# Patient Record
Sex: Female | Born: 1960 | Race: Black or African American | Hispanic: No | Smoking: Never smoker
Health system: Southern US, Community
[De-identification: ages and names within clinical notes are randomized; demographics above are authoritative.]

## PROBLEM LIST (undated history)

## (undated) DIAGNOSIS — T4145XA Adverse effect of unspecified anesthetic, initial encounter: Secondary | ICD-10-CM

## (undated) DIAGNOSIS — I10 Essential (primary) hypertension: Secondary | ICD-10-CM

## (undated) DIAGNOSIS — Z9289 Personal history of other medical treatment: Secondary | ICD-10-CM

## (undated) DIAGNOSIS — D649 Anemia, unspecified: Secondary | ICD-10-CM

## (undated) DIAGNOSIS — K922 Gastrointestinal hemorrhage, unspecified: Secondary | ICD-10-CM

## (undated) HISTORY — PX: BREAST CYST ASPIRATION: SHX578

## (undated) HISTORY — DX: Gastrointestinal hemorrhage, unspecified: K92.2

## (undated) HISTORY — PX: BREAST BIOPSY: SHX20

---

## 1995-06-17 DIAGNOSIS — T8859XA Other complications of anesthesia, initial encounter: Secondary | ICD-10-CM

## 1995-06-17 HISTORY — DX: Other complications of anesthesia, initial encounter: T88.59XA

## 2004-05-14 ENCOUNTER — Ambulatory Visit: Payer: Self-pay

## 2005-05-20 ENCOUNTER — Ambulatory Visit: Payer: Self-pay | Admitting: Obstetrics and Gynecology

## 2005-06-03 ENCOUNTER — Ambulatory Visit: Payer: Self-pay | Admitting: Obstetrics and Gynecology

## 2006-05-26 ENCOUNTER — Ambulatory Visit: Payer: Self-pay | Admitting: Obstetrics and Gynecology

## 2006-06-12 ENCOUNTER — Ambulatory Visit: Payer: Self-pay | Admitting: Obstetrics and Gynecology

## 2007-06-29 ENCOUNTER — Ambulatory Visit: Payer: Self-pay | Admitting: Obstetrics and Gynecology

## 2008-08-08 ENCOUNTER — Ambulatory Visit: Payer: Self-pay | Admitting: Obstetrics and Gynecology

## 2009-08-09 ENCOUNTER — Ambulatory Visit: Payer: Self-pay | Admitting: Obstetrics and Gynecology

## 2010-08-28 ENCOUNTER — Ambulatory Visit: Payer: Self-pay | Admitting: Obstetrics and Gynecology

## 2011-09-04 ENCOUNTER — Ambulatory Visit: Payer: Self-pay | Admitting: Obstetrics and Gynecology

## 2012-10-11 ENCOUNTER — Ambulatory Visit: Payer: Self-pay | Admitting: Obstetrics and Gynecology

## 2012-11-02 ENCOUNTER — Ambulatory Visit: Payer: Self-pay | Admitting: Family Medicine

## 2012-12-14 ENCOUNTER — Ambulatory Visit: Payer: Self-pay | Admitting: Emergency Medicine

## 2014-01-17 ENCOUNTER — Ambulatory Visit: Payer: Self-pay | Admitting: Obstetrics and Gynecology

## 2015-01-11 ENCOUNTER — Emergency Department
Admission: EM | Admit: 2015-01-11 | Discharge: 2015-01-12 | Disposition: A | Discharge status: Home / Self Care | Payer: BLUE CROSS/BLUE SHIELD | Attending: Student | Admitting: Student

## 2015-01-11 ENCOUNTER — Encounter: Payer: Self-pay | Admitting: Emergency Medicine

## 2015-01-11 DIAGNOSIS — X58XXXA Exposure to other specified factors, initial encounter: Secondary | ICD-10-CM | POA: Insufficient documentation

## 2015-01-11 DIAGNOSIS — L500 Allergic urticaria: Secondary | ICD-10-CM | POA: Diagnosis not present

## 2015-01-11 DIAGNOSIS — Y9289 Other specified places as the place of occurrence of the external cause: Secondary | ICD-10-CM | POA: Insufficient documentation

## 2015-01-11 DIAGNOSIS — Y998 Other external cause status: Secondary | ICD-10-CM | POA: Diagnosis not present

## 2015-01-11 DIAGNOSIS — Y9389 Activity, other specified: Secondary | ICD-10-CM | POA: Insufficient documentation

## 2015-01-11 DIAGNOSIS — T7840XA Allergy, unspecified, initial encounter: Secondary | ICD-10-CM | POA: Insufficient documentation

## 2015-01-11 DIAGNOSIS — I10 Essential (primary) hypertension: Secondary | ICD-10-CM | POA: Diagnosis not present

## 2015-01-11 HISTORY — DX: Essential (primary) hypertension: I10

## 2015-01-11 MED ORDER — EPINEPHRINE 0.3 MG/0.3ML IJ SOAJ: 0.3000 mg | Freq: Once | INTRAMUSCULAR | Status: DC

## 2015-01-11 MED ORDER — FAMOTIDINE IN NACL 20-0.9 MG/50ML-% IV SOLN
20.0000 mg | Freq: Once | INTRAVENOUS | Status: AC
  Administered 2015-01-11: 20 mg via INTRAVENOUS
  Filled 2015-01-11: qty 50

## 2015-01-11 MED ORDER — SODIUM CHLORIDE 0.9 % IV BOLUS (SEPSIS)
1000.0000 mL | Freq: Once | INTRAVENOUS | Status: AC
  Administered 2015-01-11: 1000 mL via INTRAVENOUS

## 2015-01-11 MED ORDER — DIPHENHYDRAMINE HCL 50 MG/ML IJ SOLN
INTRAMUSCULAR | Status: AC
  Administered 2015-01-11: 25 mg via INTRAVENOUS
  Filled 2015-01-11: qty 1

## 2015-01-11 MED ORDER — METHYLPREDNISOLONE SODIUM SUCC 125 MG IJ SOLR
125.0000 mg | Freq: Once | INTRAMUSCULAR | Status: AC
  Administered 2015-01-11: 125 mg via INTRAVENOUS

## 2015-01-11 MED ORDER — DIPHENHYDRAMINE HCL 50 MG/ML IJ SOLN
25.0000 mg | Freq: Once | INTRAMUSCULAR | Status: AC
  Administered 2015-01-11: 25 mg via INTRAVENOUS

## 2015-01-11 MED ORDER — PREDNISONE 20 MG PO TABS: 60.0000 mg | ORAL_TABLET | Freq: Every day | ORAL | Status: DC

## 2015-01-11 MED ORDER — EPINEPHRINE HCL 1 MG/ML IJ SOLN
INTRAMUSCULAR | Status: AC
  Administered 2015-01-11: 0.3 mg
  Filled 2015-01-11: qty 1

## 2015-01-11 MED ORDER — METHYLPREDNISOLONE SODIUM SUCC 125 MG IJ SOLR
INTRAMUSCULAR | Status: AC
  Administered 2015-01-11: 125 mg via INTRAVENOUS
  Filled 2015-01-11: qty 2

## 2015-01-11 MED ORDER — EPINEPHRINE HCL 0.1 MG/ML IJ SOSY: 0.3000 mg | PREFILLED_SYRINGE | Freq: Once | INTRAMUSCULAR | Status: AC

## 2015-01-11 NOTE — ED Provider Notes (Signed)
[facility] Emergency Department Provider Note  ____________________________________________  Time seen: Approximately 10:47 PM  I have reviewed the triage vital signs and the nursing notes.   HISTORY  Chief Complaint Allergic Reaction    HPI Cathy Clark is a 54 y.o. female with history of hypertension on metoprolol and Norvasc presents for evaluation of allergic reaction which began suddenly just prior to arrival. Patient reports that this evening she developed rash as well as severe swelling of her upper lip. No trouble breathing, no cough, no vomiting. She has known allergy to shrimp however has not come in contact with any shrimp/shellfish. She has taken aspirin and BC powders but does not take ACE inhibitor or arb. Current severity of symptoms is moderate to severe. No modifying factors. Prior to today she had been in her usual state of health.   Past Medical History  Diagnosis Date  . Hypertension     There are no active problems to display for this patient.   History reviewed. No pertinent past surgical history.  No current outpatient prescriptions on file.  Allergies Shrimp  No family history on file.  Social History History  Substance Use Topics  . Smoking status: Never Smoker   . Smokeless tobacco: Not on file  . Alcohol Use: No    Review of Systems Constitutional: No fever/chills Eyes: No visual changes. ENT: No sore throat. Cardiovascular: Denies chest pain. Respiratory: Denies shortness of breath. Gastrointestinal: No abdominal pain.  No nausea, no vomiting.  No diarrhea.  No constipation. Genitourinary: Negative for dysuria. Musculoskeletal: Negative for back pain. Skin: Negative for rash. Neurological: Negative for headaches, focal weakness or numbness.  10-point ROS otherwise negative.  ____________________________________________   PHYSICAL EXAM:  VITAL SIGNS: ED Triage Vitals  Enc Vitals Group     BP  01/11/15 2244 138/97 mmHg     Pulse Rate 01/11/15 2244 96     Resp 01/11/15 2244 20     Temp 01/11/15 2244 100.2 F (37.9 C)     Temp Source 01/11/15 2244 Oral     SpO2 01/11/15 2244 100 %     Weight 01/11/15 2244 142 lb (64.411 kg)     Height 01/11/15 2244 5\' 6"  (1.676 m)     Head Cir --      Peak Flow --      Pain Score --      Pain Loc --      Pain Edu? --      Excl. in GC? --     Constitutional: Alert and oriented. Nontoxic appearing, in no acute distress. Eyes: Conjunctivae are normal. PERRL. EOMI. Head: Atraumatic. Nose: No congestion/rhinnorhea. Mouth/Throat: Mucous membranes are moist.  Oropharynx non-erythematous. Clear oropharynx. Severe edema of the upper lip. Neck: No stridor.   Cardiovascular: Normal rate, regular rhythm. Grossly normal heart sounds.  Good peripheral circulation. Respiratory: Normal respiratory effort.  No retractions. Lungs CTAB. Gastrointestinal: Soft and nontender. No distention. No abdominal bruits. No CVA tenderness. Genitourinary: deferred Musculoskeletal: No lower extremity tenderness nor edema.  No joint effusions. Neurologic:  Normal speech and language. No gross focal neurologic deficits are appreciated. No gait instability. Skin:  Diffuse raised wheals consistent with urticaria Psychiatric: Mood and affect are normal. Speech and behavior are normal.  ____________________________________________   LABS (all labs ordered are listed, but only abnormal results are displayed)  Labs Reviewed - No data to display ____________________________________________  EKG  none ____________________________________________  RADIOLOGY  none ____________________________________________   PROCEDURES  Procedure(s)  performed: None  Critical Care performed: None ____________________________________________   INITIAL IMPRESSION / ASSESSMENT AND PLAN / ED COURSE  Pertinent labs & imaging results that were available during my care of the  patient were reviewed by me and considered in my medical decision making (see chart for details).  Cathy Clark is a 54 y.o. female with history of hypertension on metoprolol and Norvasc presents for evaluation of severe allergic reaction (cause unknown) which began suddenly just prior to arrival. On arrival to the emergency department, she is in no acute distress but she does have severe swelling isolated to the upper lip. No airway involvement or concern for impending airway collapse. She also has diffuse urticaria. We'll treat with epinephrine, fluids, Solu-Medrol, Pepcid, Benadryl with frequent reassessments. If her symptoms improve after sufficient observation, anticipate discharge home. Care transferred to Dr. Fanny Bien at 11 PM. ____________________________________________   FINAL CLINICAL IMPRESSION(S) / ED DIAGNOSES  Final diagnoses:  Allergic reaction, initial encounter      Gayla Doss, MD 01/11/15 [phone]

## 2015-01-11 NOTE — ED Notes (Addendum)
Patient present to ED with c/o allergic reaction to possibly blood pressure medicine, patient takes Metoprolol and Norvasc. Welts noted to bilateral arms and swelling noted to upper lips and right eye. Patient speaking in complete sentences, no increased work in respiration noted. Patient reports symptoms began within 1 hour to 1.5 hour PTA. MD at bedside.

## 2015-01-12 NOTE — ED Provider Notes (Signed)
-----------------------------------------   2:57 AM on 01/12/2015 -----------------------------------------  Reevaluated the patient. She is here now stable and she states that her hives are gone and her swelling remains in her lip. Patient does have angioedema of the upper lip without evidence of airway compromise. Oropharynx is clear. Because of the ongoing angioedema I'll continue to observe her and I did discuss and reviewed her medication list and I'm concerned Norvasc could be an etiology. I have instructed her to stop Norvasc, and she is agreeable. We'll continue to observe her in the ER for additional 3 hours, should her swelling improved we would discharge her. I did discuss good return precautions and treatment recommendations and she is very reasonable and accommodating. No distress.  ----------------------------------------- 4:56 AM on 01/12/2015 -----------------------------------------  Patient's lip edema has improved significantly. No oral edema. No hives, itching rate clear lungs and stable vital signs. She is fully awake and alert. We'll discharge her to home with prescription for an epinephrine pen for which I have instructed her on use and to call 911 right away should she need to use it for another severe reaction. She will stop Norvasc and follow-up with her doctor.  Discharge to home Condition much improved.  Sharyn Creamer, MD 01/12/15 [phone]

## 2015-01-12 NOTE — ED Notes (Signed)

## 2016-03-10 ENCOUNTER — Other Ambulatory Visit: Payer: Self-pay | Admitting: Obstetrics and Gynecology

## 2016-03-10 DIAGNOSIS — Z1231 Encounter for screening mammogram for malignant neoplasm of breast: Secondary | ICD-10-CM

## 2016-03-25 ENCOUNTER — Ambulatory Visit: Payer: BLUE CROSS/BLUE SHIELD

## 2016-03-27 ENCOUNTER — Ambulatory Visit
Admission: RE | Admit: 2016-03-27 | Discharge: 2016-03-27 | Disposition: A | Discharge status: Home / Self Care | Payer: BLUE CROSS/BLUE SHIELD | Source: Ambulatory Visit | Attending: Obstetrics and Gynecology | Admitting: Obstetrics and Gynecology

## 2016-03-27 DIAGNOSIS — Z1231 Encounter for screening mammogram for malignant neoplasm of breast: Secondary | ICD-10-CM

## 2016-03-31 DIAGNOSIS — I10 Essential (primary) hypertension: Secondary | ICD-10-CM | POA: Diagnosis not present

## 2016-04-01 DIAGNOSIS — D259 Leiomyoma of uterus, unspecified: Secondary | ICD-10-CM | POA: Diagnosis not present

## 2016-04-01 DIAGNOSIS — Z01419 Encounter for gynecological examination (general) (routine) without abnormal findings: Secondary | ICD-10-CM | POA: Diagnosis not present

## 2016-04-01 DIAGNOSIS — Z6824 Body mass index (BMI) 24.0-24.9, adult: Secondary | ICD-10-CM | POA: Diagnosis not present

## 2016-04-01 DIAGNOSIS — Z1231 Encounter for screening mammogram for malignant neoplasm of breast: Secondary | ICD-10-CM | POA: Diagnosis not present

## 2016-09-16 ENCOUNTER — Encounter: Payer: Self-pay | Admitting: Family Medicine

## 2016-09-16 ENCOUNTER — Ambulatory Visit (INDEPENDENT_AMBULATORY_CARE_PROVIDER_SITE_OTHER): Payer: BLUE CROSS/BLUE SHIELD | Admitting: Family Medicine

## 2016-09-16 VITALS — BP 130/92 | HR 64 | Temp 98.2°F | Ht 64.0 in | Wt 146.0 lb

## 2016-09-16 DIAGNOSIS — Z1211 Encounter for screening for malignant neoplasm of colon: Secondary | ICD-10-CM | POA: Diagnosis not present

## 2016-09-16 DIAGNOSIS — Z130 Encounter for screening for diseases of the blood and blood-forming organs and certain disorders involving the immune mechanism: Secondary | ICD-10-CM

## 2016-09-16 DIAGNOSIS — Z0000 Encounter for general adult medical examination without abnormal findings: Secondary | ICD-10-CM

## 2016-09-16 DIAGNOSIS — Z13220 Encounter for screening for lipoid disorders: Secondary | ICD-10-CM

## 2016-09-16 DIAGNOSIS — M62838 Other muscle spasm: Secondary | ICD-10-CM | POA: Insufficient documentation

## 2016-09-16 DIAGNOSIS — I10 Essential (primary) hypertension: Secondary | ICD-10-CM | POA: Diagnosis not present

## 2016-09-16 DIAGNOSIS — G5600 Carpal tunnel syndrome, unspecified upper limb: Secondary | ICD-10-CM | POA: Insufficient documentation

## 2016-09-16 DIAGNOSIS — G5603 Carpal tunnel syndrome, bilateral upper limbs: Secondary | ICD-10-CM

## 2016-09-16 LAB — HEMOGLOBIN A1C: Hgb A1c MFr Bld: 6.1 % (ref 4.6–6.5)

## 2016-09-16 LAB — LIPID PANEL
CHOL/HDL RATIO: 2
Cholesterol: 172 mg/dL (ref 0–200)
HDL: 73.5 mg/dL (ref >39.00)
LDL Cholesterol: 87 mg/dL (ref 0–99)
NonHDL: 98.37
TRIGLYCERIDES: 57 mg/dL (ref 0.0–149.0)
VLDL: 11.4 mg/dL (ref 0.0–40.0)

## 2016-09-16 LAB — COMPREHENSIVE METABOLIC PANEL
ALK PHOS: 76 U/L (ref 39–117)
ALT: 38 U/L — ABNORMAL HIGH (ref 0–35)
AST: 30 U/L (ref 0–37)
Albumin: 3.7 g/dL (ref 3.5–5.2)
BUN: 11 mg/dL (ref 6–23)
CO2: 28 meq/L (ref 19–32)
Calcium: 9.4 mg/dL (ref 8.4–10.5)
Chloride: 103 mEq/L (ref 96–112)
Creatinine, Ser: 0.83 mg/dL (ref 0.40–1.20)
GFR: 92.19 mL/min (ref >60.00)
Glucose, Bld: 85 mg/dL (ref 70–99)
POTASSIUM: 3.7 meq/L (ref 3.5–5.1)
Sodium: 137 mEq/L (ref 135–145)
Total Bilirubin: 0.6 mg/dL (ref 0.2–1.2)
Total Protein: 7.5 g/dL (ref 6.0–8.3)

## 2016-09-16 LAB — CBC
HEMATOCRIT: 36.9 % (ref 36.0–46.0)
HEMOGLOBIN: 12 g/dL (ref 12.0–15.0)
MCHC: 32.6 g/dL (ref 30.0–36.0)
MCV: 94 fl (ref 78.0–100.0)
Platelets: 203 10*3/uL (ref 150.0–400.0)
RBC: 3.92 Mil/uL (ref 3.87–5.11)
RDW: 13.7 % (ref 11.5–15.5)
WBC: 7.1 10*3/uL (ref 4.0–10.5)

## 2016-09-16 MED ORDER — CYCLOBENZAPRINE HCL 10 MG PO TABS: 10.0000 mg | ORAL_TABLET | Freq: Three times a day (TID) | ORAL | 0 refills | Status: DC | PRN

## 2016-09-16 MED ORDER — AMLODIPINE BESYLATE 10 MG PO TABS: 10.0000 mg | ORAL_TABLET | Freq: Every day | ORAL | 3 refills | Status: DC

## 2016-09-16 NOTE — Assessment & Plan Note (Signed)
Severe per report. Discussed treatment options. Patient to consider injection vs surgery.

## 2016-09-16 NOTE — Assessment & Plan Note (Signed)
Stopping metoprolol (patient not happy with pill size). Starting Norvasc 10 mg daily. Patient to check BP regularly. Labs today.

## 2016-09-16 NOTE — Assessment & Plan Note (Signed)
PRN Flexeril.

## 2016-09-16 NOTE — Patient Instructions (Signed)
Stop the metoprolol.  Start amlodipine.  Get your mammogram later this year.  We will arrange the cologuard.  Keep an eye on your BP's.  Take care  Dr. Lacinda Axon   Health Maintenance, Female Adopting a healthy lifestyle and getting preventive care can go a long way to promote health and wellness. Talk with your health care provider about what schedule of regular examinations is right for you. This is a good chance for you to check in with your provider about disease prevention and staying healthy. In between checkups, there are plenty of things you can do on your own. Experts have done a lot of research about which lifestyle changes and preventive measures are most likely to keep you healthy. Ask your health care provider for more information. Weight and diet Eat a healthy diet  Be sure to include plenty of vegetables, fruits, low-fat dairy products, and lean protein.  Do not eat a lot of foods high in solid fats, added sugars, or salt.  Get regular exercise. This is one of the most important things you can do for your health.  Most adults should exercise for at least 150 minutes each week. The exercise should increase your heart rate and make you sweat (moderate-intensity exercise).  Most adults should also do strengthening exercises at least twice a week. This is in addition to the moderate-intensity exercise. Maintain a healthy weight  Body mass index (BMI) is a measurement that can be used to identify possible weight problems. It estimates body fat based on height and weight. Your health care provider can help determine your BMI and help you achieve or maintain a healthy weight.  For females 63 years of age and older:  A BMI below 18.5 is considered underweight.  A BMI of 18.5 to 24.9 is normal.  A BMI of 25 to 29.9 is considered overweight.  A BMI of 30 and above is considered obese. Watch levels of cholesterol and blood lipids  You should start having your blood tested  for lipids and cholesterol at 54 years of age, then have this test every 5 years.  You may need to have your cholesterol levels checked more often if:  Your lipid or cholesterol levels are high.  You are older than 54 years of age.  You are at high risk for heart disease. Cancer screening Lung Cancer  Lung cancer screening is recommended for adults 51-24 years old who are at high risk for lung cancer because of a history of smoking.  A yearly low-dose CT scan of the lungs is recommended for people who:  Currently smoke.  Have quit within the past 15 years.  Have at least a 30-pack-year history of smoking. A pack year is smoking an average of one pack of cigarettes a day for 1 year.  Yearly screening should continue until it has been 15 years since you quit.  Yearly screening should stop if you develop a health problem that would prevent you from having lung cancer treatment. Breast Cancer  Practice breast self-awareness. This means understanding how your breasts normally appear and feel.  It also means doing regular breast self-exams. Let your health care provider know about any changes, no matter how small.  If you are in your 20s or 30s, you should have a clinical breast exam (CBE) by a health care provider every 1-3 years as part of a regular health exam.  If you are 60 or older, have a CBE every year. Also consider having a breast  X-ray (mammogram) every year.  If you have a family history of breast cancer, talk to your health care provider about genetic screening.  If you are at high risk for breast cancer, talk to your health care provider about having an MRI and a mammogram every year.  Breast cancer gene (BRCA) assessment is recommended for women who have family members with BRCA-related cancers. BRCA-related cancers include:  Breast.  Ovarian.  Tubal.  Peritoneal cancers.  Results of the assessment will determine the need for genetic counseling and BRCA1 and  BRCA2 testing. Cervical Cancer  Your health care provider may recommend that you be screened regularly for cancer of the pelvic organs (ovaries, uterus, and vagina). This screening involves a pelvic examination, including checking for microscopic changes to the surface of your cervix (Pap test). You may be encouraged to have this screening done every 3 years, beginning at age 109.  For women ages 41-65, health care providers may recommend pelvic exams and Pap testing every 3 years, or they may recommend the Pap and pelvic exam, combined with testing for human papilloma virus (HPV), every 5 years. Some types of HPV increase your risk of cervical cancer. Testing for HPV may also be done on women of any age with unclear Pap test results.  Other health care providers may not recommend any screening for nonpregnant women who are considered low risk for pelvic cancer and who do not have symptoms. Ask your health care provider if a screening pelvic exam is right for you.  If you have had past treatment for cervical cancer or a condition that could lead to cancer, you need Pap tests and screening for cancer for at least 20 years after your treatment. If Pap tests have been discontinued, your risk factors (such as having a new sexual partner) need to be reassessed to determine if screening should resume. Some women have medical problems that increase the chance of getting cervical cancer. In these cases, your health care provider may recommend more frequent screening and Pap tests. Colorectal Cancer  This type of cancer can be detected and often prevented.  Routine colorectal cancer screening usually begins at 54 years of age and continues through 54 years of age.  Your health care provider may recommend screening at an earlier age if you have risk factors for colon cancer.  Your health care provider may also recommend using home test kits to check for hidden blood in the stool.  A small camera at the end  of a tube can be used to examine your colon directly (sigmoidoscopy or colonoscopy). This is done to check for the earliest forms of colorectal cancer.  Routine screening usually begins at age 27.  Direct examination of the colon should be repeated every 5-10 years through 54 years of age. However, you may need to be screened more often if early forms of precancerous polyps or small growths are found. Skin Cancer  Check your skin from head to toe regularly.  Tell your health care provider about any new moles or changes in moles, especially if there is a change in a mole's shape or color.  Also tell your health care provider if you have a mole that is larger than the size of a pencil eraser.  Always use sunscreen. Apply sunscreen liberally and repeatedly throughout the day.  Protect yourself by wearing long sleeves, pants, a wide-brimmed hat, and sunglasses whenever you are outside. Heart disease, diabetes, and high blood pressure  High blood pressure causes heart  disease and increases the risk of stroke. High blood pressure is more likely to develop in:  People who have blood pressure in the high end of the normal range (130-139/85-89 mm Hg).  People who are overweight or obese.  People who are African American.  If you are 65-41 years of age, have your blood pressure checked every 3-5 years. If you are 32 years of age or older, have your blood pressure checked every year. You should have your blood pressure measured twice-once when you are at a hospital or clinic, and once when you are not at a hospital or clinic. Record the average of the two measurements. To check your blood pressure when you are not at a hospital or clinic, you can use:  An automated blood pressure machine at a pharmacy.  A home blood pressure monitor.  If you are between 56 years and 92 years old, ask your health care provider if you should take aspirin to prevent strokes.  Have regular diabetes screenings.  This involves taking a blood sample to check your fasting blood sugar level.  If you are at a normal weight and have a low risk for diabetes, have this test once every three years after 54 years of age.  If you are overweight and have a high risk for diabetes, consider being tested at a younger age or more often. Preventing infection Hepatitis B  If you have a higher risk for hepatitis B, you should be screened for this virus. You are considered at high risk for hepatitis B if:  You were born in a country where hepatitis B is common. Ask your health care provider which countries are considered high risk.  Your parents were born in a high-risk country, and you have not been immunized against hepatitis B (hepatitis B vaccine).  You have HIV or AIDS.  You use needles to inject street drugs.  You live with someone who has hepatitis B.  You have had sex with someone who has hepatitis B.  You get hemodialysis treatment.  You take certain medicines for conditions, including cancer, organ transplantation, and autoimmune conditions. Hepatitis C  Blood testing is recommended for:  Everyone born from [birth year] through 1963.  Anyone with known risk factors for hepatitis C. Sexually transmitted infections (STIs)  You should be screened for sexually transmitted infections (STIs) including gonorrhea and chlamydia if:  You are sexually active and are younger than 54 years of age.  You are older than 54 years of age and your health care provider tells you that you are at risk for this type of infection.  Your sexual activity has changed since you were last screened and you are at an increased risk for chlamydia or gonorrhea. Ask your health care provider if you are at risk.  If you do not have HIV, but are at risk, it may be recommended that you take a prescription medicine daily to prevent HIV infection. This is called pre-exposure prophylaxis (PrEP). You are considered at risk if:  You are  sexually active and do not regularly use condoms or know the HIV status of your partner(s).  You take drugs by injection.  You are sexually active with a partner who has HIV. Talk with your health care provider about whether you are at high risk of being infected with HIV. If you choose to begin PrEP, you should first be tested for HIV. You should then be tested every 3 months for as long as you are taking PrEP.  Pregnancy  If you are premenopausal and you may become pregnant, ask your health care provider about preconception counseling.  If you may become pregnant, take 400 to 800 micrograms (mcg) of folic acid every day.  If you want to prevent pregnancy, talk to your health care provider about birth control (contraception). Osteoporosis and menopause  Osteoporosis is a disease in which the bones lose minerals and strength with aging. This can result in serious bone fractures. Your risk for osteoporosis can be identified using a bone density scan.  If you are 51 years of age or older, or if you are at risk for osteoporosis and fractures, ask your health care provider if you should be screened.  Ask your health care provider whether you should take a calcium or vitamin D supplement to lower your risk for osteoporosis.  Menopause may have certain physical symptoms and risks.  Hormone replacement therapy may reduce some of these symptoms and risks. Talk to your health care provider about whether hormone replacement therapy is right for you. Follow these instructions at home:  Schedule regular health, dental, and eye exams.  Stay current with your immunizations.  Do not use any tobacco products including cigarettes, chewing tobacco, or electronic cigarettes.  If you are pregnant, do not drink alcohol.  If you are breastfeeding, limit how much and how often you drink alcohol.  Limit alcohol intake to no more than 1 drink per day for nonpregnant women. One drink equals 12 ounces of  beer, 5 ounces of wine, or 1 ounces of hard liquor.  Do not use street drugs.  Do not share needles.  Ask your health care provider for help if you need support or information about quitting drugs.  Tell your health care provider if you often feel depressed.  Tell your health care provider if you have ever been abused or do not feel safe at home. This information is not intended to replace advice given to you by your health care provider. Make sure you discuss any questions you have with your health care provider. Document Released: 12/16/2010 Document Revised: 11/08/2015 Document Reviewed: 03/06/2015 Elsevier Interactive Patient Education  2017 [employer].

## 2016-09-16 NOTE — Progress Notes (Signed)
Subjective:  Patient ID: Cathy Clark, female    DOB: 01/20/1961  Age: 56 y.o. MRN: [id number]  CC: Establish care - HTN, Spasm, CTS  HPI Cathy Clark is a 56 y.o. female presents to the clinic today to establish care. Issues are below.  Hypertension  BP elevated today.  Currently taking metoprolol.  Was previously on Norvasc and lisinopril (has allergy to lisinopril).  Will discuss today.  Back spasm  Patient reports intermittent muscle spasm of the back.   No recent fall, trauma, injury.  Improves with topical treatment.  Still quite bothersome intermittently.  She would like to discuss having a muscle relaxant on hand.  Carpal tunnel syndrome  Patient reports significant pain and numbness/tingling particularly at night.  Bilateral.  She states that she's been told previously that she has severe carpal tunnel.  No medications or interventions tried.  No known relieving factors.  She would like to discuss options today.  PMH, Surgical Hx, Family Hx, Social History reviewed and updated as below.  Past Medical History:  Diagnosis Date  . Hypertension    Past Surgical History:  Procedure Laterality Date  . BREAST BIOPSY     Family History  Problem Relation Age of Onset  . BRCA 1/2 Mother    Social History  Substance Use Topics  . Smoking status: Never Smoker  . Smokeless tobacco: Never Used  . Alcohol use No   Review of Systems  Musculoskeletal:       Bilateral CTS.  All other systems reviewed and are negative.  Objective:   Today's Vitals: BP (!) 130/92   Pulse 64   Temp 98.2 F (36.8 C) (Oral)   Ht '5\' 4"'  (1.626 m)   Wt 146 lb (66.2 kg)   SpO2 94%   BMI 25.06 kg/m   Physical Exam  Constitutional: She is oriented to person, place, and time. She appears well-developed and well-nourished. No distress.  HENT:  Head: Normocephalic and atraumatic.  Nose: Nose normal.  Mouth/Throat: Oropharynx is clear and moist. No  oropharyngeal exudate.  Normal TM's bilaterally.   Eyes: Conjunctivae are normal. No scleral icterus.  Neck: Neck supple.  Cardiovascular: Normal rate and regular rhythm.   No murmur heard. Pulmonary/Chest: Effort normal and breath sounds normal. She has no wheezes. She has no rales.  Abdominal: Soft. She exhibits no distension. There is no tenderness. There is no rebound and no guarding.  Musculoskeletal: Normal range of motion. She exhibits no edema.  Lymphadenopathy:    She has no cervical adenopathy.  Neurological: She is alert and oriented to person, place, and time.  Skin: Skin is warm and dry. No rash noted.  Psychiatric: She has a normal mood and affect.  Vitals reviewed.  Assessment & Plan:   Problem List Items Addressed This Visit    Carpal tunnel syndrome    Severe per report. Discussed treatment options. Patient to consider injection vs surgery.      Relevant Medications   cyclobenzaprine (FLEXERIL) 10 MG tablet   Essential hypertension - Primary    Stopping metoprolol (patient not happy with pill size). Starting Norvasc 10 mg daily. Patient to check BP regularly. Labs today.      Relevant Medications   amLODipine (NORVASC) 10 MG tablet   Other Relevant Orders   Hemoglobin A1c   Comprehensive metabolic panel   Muscle spasm    PRN Flexeril.        Other Visit Diagnoses    Screening, lipid  Relevant Orders   Lipid panel   Screening for deficiency anemia       Relevant Orders   CBC   Colon cancer screening       Relevant Orders   Cologuard     Meds ordered this encounter  Medications  . amLODipine (NORVASC) 10 MG tablet    Sig: Take 1 tablet (10 mg total) by mouth daily.    Dispense:  90 tablet    Refill:  3  . cyclobenzaprine (FLEXERIL) 10 MG tablet    Sig: Take 1 tablet (10 mg total) by mouth 3 (three) times daily as needed for muscle spasms.    Dispense:  30 tablet    Refill:  0   Follow-up: Pending BP's and labs.  [place]

## 2016-09-29 ENCOUNTER — Telehealth: Payer: Self-pay | Admitting: *Deleted

## 2016-09-29 NOTE — Telephone Encounter (Signed)
Pt was advised by dr. Adriana Simas that she may have some swelling in her ankles after starting amlodipine. Pt reported light swelling in her right ankle. Pt has to stand daily at work.  Pt contact [phone]

## 2016-09-30 NOTE — Telephone Encounter (Signed)
Called patient but was unable to leave a voicemail

## 2016-09-30 NOTE — Telephone Encounter (Signed)
We can switch if she likes.

## 2016-09-30 NOTE — Telephone Encounter (Signed)
I would continue, especially if it is not bilateral.

## 2016-09-30 NOTE — Telephone Encounter (Signed)
Pt would like to switch to another medication.

## 2016-09-30 NOTE — Telephone Encounter (Signed)
Pt called back and stated lower legs are stinging in pain.

## 2016-10-01 ENCOUNTER — Other Ambulatory Visit: Payer: Self-pay | Admitting: Family Medicine

## 2016-10-01 MED ORDER — LOSARTAN POTASSIUM 50 MG PO TABS: 50.0000 mg | ORAL_TABLET | Freq: Every day | ORAL | 3 refills | Status: DC

## 2016-10-01 NOTE — Telephone Encounter (Signed)
Rx sent. Needs BMP in 1 week.

## 2016-10-01 NOTE — Telephone Encounter (Signed)
Pt called and scheduled for 10/09/16. Place orders for BMP.

## 2016-10-02 ENCOUNTER — Other Ambulatory Visit: Payer: Self-pay | Admitting: Family Medicine

## 2016-10-02 DIAGNOSIS — I10 Essential (primary) hypertension: Secondary | ICD-10-CM

## 2016-10-09 ENCOUNTER — Other Ambulatory Visit (INDEPENDENT_AMBULATORY_CARE_PROVIDER_SITE_OTHER): Payer: BLUE CROSS/BLUE SHIELD

## 2016-10-09 DIAGNOSIS — I10 Essential (primary) hypertension: Secondary | ICD-10-CM | POA: Diagnosis not present

## 2016-10-09 LAB — BASIC METABOLIC PANEL
BUN: 12 mg/dL (ref 6–23)
CHLORIDE: 105 meq/L (ref 96–112)
CO2: 29 mEq/L (ref 19–32)
CREATININE: 0.83 mg/dL (ref 0.40–1.20)
Calcium: 9.6 mg/dL (ref 8.4–10.5)
GFR: 92.17 mL/min (ref >60.00)
Glucose, Bld: 81 mg/dL (ref 70–99)
Potassium: 4 mEq/L (ref 3.5–5.1)
Sodium: 138 mEq/L (ref 135–145)

## 2016-11-14 DIAGNOSIS — K922 Gastrointestinal hemorrhage, unspecified: Secondary | ICD-10-CM

## 2016-11-14 HISTORY — DX: Gastrointestinal hemorrhage, unspecified: K92.2

## 2016-11-17 ENCOUNTER — Telehealth: Payer: Self-pay

## 2016-11-17 NOTE — Telephone Encounter (Signed)
COLOGUARD suspended for inactivity

## 2016-11-28 ENCOUNTER — Encounter: Payer: Self-pay | Admitting: Emergency Medicine

## 2016-11-28 ENCOUNTER — Emergency Department
Admission: EM | Admit: 2016-11-28 | Discharge: 2016-11-28 | Disposition: A | Discharge status: Other Acute Inpatient Hospital | Payer: BLUE CROSS/BLUE SHIELD | Attending: Emergency Medicine | Admitting: Emergency Medicine

## 2016-11-28 ENCOUNTER — Inpatient Hospital Stay (HOSPITAL_COMMUNITY)
Admission: AD | Admit: 2016-11-28 | Discharge: 2016-12-02 | DRG: 377 | Disposition: A | Discharge status: Other Acute Inpatient Hospital | Payer: BLUE CROSS/BLUE SHIELD | Source: Other Acute Inpatient Hospital | Attending: Internal Medicine | Admitting: Internal Medicine

## 2016-11-28 ENCOUNTER — Encounter (HOSPITAL_COMMUNITY): Payer: Self-pay | Admitting: General Practice

## 2016-11-28 DIAGNOSIS — K921 Melena: Secondary | ICD-10-CM | POA: Diagnosis not present

## 2016-11-28 DIAGNOSIS — D509 Iron deficiency anemia, unspecified: Secondary | ICD-10-CM | POA: Diagnosis not present

## 2016-11-28 DIAGNOSIS — R404 Transient alteration of awareness: Secondary | ICD-10-CM | POA: Diagnosis not present

## 2016-11-28 DIAGNOSIS — D649 Anemia, unspecified: Secondary | ICD-10-CM | POA: Diagnosis not present

## 2016-11-28 DIAGNOSIS — Z79899 Other long term (current) drug therapy: Secondary | ICD-10-CM | POA: Insufficient documentation

## 2016-11-28 DIAGNOSIS — I10 Essential (primary) hypertension: Secondary | ICD-10-CM | POA: Diagnosis not present

## 2016-11-28 DIAGNOSIS — Z803 Family history of malignant neoplasm of breast: Secondary | ICD-10-CM

## 2016-11-28 DIAGNOSIS — T39395A Adverse effect of other nonsteroidal anti-inflammatory drugs [NSAID], initial encounter: Secondary | ICD-10-CM | POA: Diagnosis present

## 2016-11-28 DIAGNOSIS — K259 Gastric ulcer, unspecified as acute or chronic, without hemorrhage or perforation: Secondary | ICD-10-CM

## 2016-11-28 DIAGNOSIS — Z9289 Personal history of other medical treatment: Secondary | ICD-10-CM

## 2016-11-28 DIAGNOSIS — D62 Acute posthemorrhagic anemia: Secondary | ICD-10-CM | POA: Diagnosis not present

## 2016-11-28 DIAGNOSIS — Z91013 Allergy to seafood: Secondary | ICD-10-CM | POA: Diagnosis not present

## 2016-11-28 DIAGNOSIS — R578 Other shock: Secondary | ICD-10-CM | POA: Diagnosis not present

## 2016-11-28 DIAGNOSIS — R55 Syncope and collapse: Secondary | ICD-10-CM

## 2016-11-28 DIAGNOSIS — Z452 Encounter for adjustment and management of vascular access device: Secondary | ICD-10-CM

## 2016-11-28 DIAGNOSIS — D696 Thrombocytopenia, unspecified: Secondary | ICD-10-CM | POA: Diagnosis not present

## 2016-11-28 DIAGNOSIS — K529 Noninfective gastroenteritis and colitis, unspecified: Secondary | ICD-10-CM | POA: Diagnosis not present

## 2016-11-28 DIAGNOSIS — K449 Diaphragmatic hernia without obstruction or gangrene: Secondary | ICD-10-CM | POA: Diagnosis present

## 2016-11-28 DIAGNOSIS — R571 Hypovolemic shock: Secondary | ICD-10-CM | POA: Diagnosis not present

## 2016-11-28 DIAGNOSIS — K2971 Gastritis, unspecified, with bleeding: Secondary | ICD-10-CM | POA: Diagnosis not present

## 2016-11-28 DIAGNOSIS — K3182 Dieulafoy lesion (hemorrhagic) of stomach and duodenum: Secondary | ICD-10-CM | POA: Diagnosis present

## 2016-11-28 DIAGNOSIS — K922 Gastrointestinal hemorrhage, unspecified: Secondary | ICD-10-CM | POA: Diagnosis not present

## 2016-11-28 DIAGNOSIS — K254 Chronic or unspecified gastric ulcer with hemorrhage: Secondary | ICD-10-CM | POA: Diagnosis not present

## 2016-11-28 DIAGNOSIS — M419 Scoliosis, unspecified: Secondary | ICD-10-CM | POA: Diagnosis not present

## 2016-11-28 DIAGNOSIS — E876 Hypokalemia: Secondary | ICD-10-CM | POA: Diagnosis not present

## 2016-11-28 DIAGNOSIS — K250 Acute gastric ulcer with hemorrhage: Secondary | ICD-10-CM | POA: Diagnosis not present

## 2016-11-28 DIAGNOSIS — E611 Iron deficiency: Secondary | ICD-10-CM | POA: Diagnosis not present

## 2016-11-28 DIAGNOSIS — D689 Coagulation defect, unspecified: Secondary | ICD-10-CM | POA: Diagnosis not present

## 2016-11-28 DIAGNOSIS — K5790 Diverticulosis of intestine, part unspecified, without perforation or abscess without bleeding: Secondary | ICD-10-CM | POA: Diagnosis not present

## 2016-11-28 DIAGNOSIS — R42 Dizziness and giddiness: Secondary | ICD-10-CM | POA: Diagnosis not present

## 2016-11-28 DIAGNOSIS — Z888 Allergy status to other drugs, medicaments and biological substances status: Secondary | ICD-10-CM

## 2016-11-28 DIAGNOSIS — K625 Hemorrhage of anus and rectum: Secondary | ICD-10-CM | POA: Diagnosis not present

## 2016-11-28 HISTORY — DX: Anemia, unspecified: D64.9

## 2016-11-28 HISTORY — DX: Adverse effect of unspecified anesthetic, initial encounter: T41.45XA

## 2016-11-28 HISTORY — DX: Personal history of other medical treatment: Z92.89

## 2016-11-28 LAB — CBC
HCT: 24.1 % — ABNORMAL LOW (ref 35.0–47.0)
HCT: 22.7 % — ABNORMAL LOW (ref 36.0–46.0)
HCT: 20.5 % — ABNORMAL LOW (ref 36.0–46.0)
HEMOGLOBIN: 7.8 g/dL — AB (ref 12.0–16.0)
HEMOGLOBIN: 7.5 g/dL — AB (ref 12.0–15.0)
HEMOGLOBIN: 6.8 g/dL — AB (ref 12.0–15.0)
MCH: 30.9 pg (ref 26.0–34.0)
MCH: 30.5 pg (ref 26.0–34.0)
MCH: 30.5 pg (ref 26.0–34.0)
MCHC: 32.6 g/dL (ref 32.0–36.0)
MCHC: 33 g/dL (ref 30.0–36.0)
MCHC: 33.2 g/dL (ref 30.0–36.0)
MCV: 94.8 fL (ref 80.0–100.0)
MCV: 92.3 fL (ref 78.0–100.0)
MCV: 91.9 fL (ref 78.0–100.0)
PLATELETS: 186 10*3/uL (ref 150–440)
PLATELETS: 150 10*3/uL (ref 150–400)
Platelets: 160 10*3/uL (ref 150–400)
RBC: 2.54 MIL/uL — ABNORMAL LOW (ref 3.80–5.20)
RBC: 2.46 MIL/uL — ABNORMAL LOW (ref 3.87–5.11)
RBC: 2.23 MIL/uL — ABNORMAL LOW (ref 3.87–5.11)
RDW: 13.7 % (ref 11.5–14.5)
RDW: 14.6 % (ref 11.5–15.5)
RDW: 14.9 % (ref 11.5–15.5)
WBC: 11.3 10*3/uL — ABNORMAL HIGH (ref 3.6–11.0)
WBC: 11.7 10*3/uL — ABNORMAL HIGH (ref 4.0–10.5)
WBC: 9.8 10*3/uL (ref 4.0–10.5)

## 2016-11-28 LAB — COMPREHENSIVE METABOLIC PANEL
ALT: 22 U/L (ref 14–54)
AST: 36 U/L (ref 15–41)
Albumin: 3.2 g/dL — ABNORMAL LOW (ref 3.5–5.0)
Alkaline Phosphatase: 61 U/L (ref 38–126)
Anion gap: 8 (ref 5–15)
BUN: 18 mg/dL (ref 6–20)
CHLORIDE: 108 mmol/L (ref 101–111)
CO2: 24 mmol/L (ref 22–32)
CREATININE: 0.84 mg/dL (ref 0.44–1.00)
Calcium: 8.8 mg/dL — ABNORMAL LOW (ref 8.9–10.3)
GFR calc Af Amer: >60 mL/min (ref >60)
Glucose, Bld: 143 mg/dL — ABNORMAL HIGH (ref 65–99)
POTASSIUM: 3.5 mmol/L (ref 3.5–5.1)
SODIUM: 140 mmol/L (ref 135–145)
Total Bilirubin: 0.4 mg/dL (ref 0.3–1.2)
Total Protein: 6.6 g/dL (ref 6.5–8.1)

## 2016-11-28 LAB — PREPARE RBC (CROSSMATCH)

## 2016-11-28 LAB — ABO/RH: ABO/RH(D): O POS

## 2016-11-28 LAB — TROPONIN I

## 2016-11-28 MED ORDER — ONDANSETRON HCL 4 MG/2ML IJ SOLN
4.0000 mg | Freq: Four times a day (QID) | INTRAMUSCULAR | Status: DC | PRN
Start: 2016-11-28 — End: 2016-11-30
  Administered 2016-11-30: 4 mg via INTRAVENOUS
  Filled 2016-11-28: qty 2

## 2016-11-28 MED ORDER — SODIUM CHLORIDE 0.9 % IV SOLN
10.0000 mL/h | Freq: Once | INTRAVENOUS | Status: AC
  Administered 2016-11-28: 10 mL/h via INTRAVENOUS

## 2016-11-28 MED ORDER — METOCLOPRAMIDE HCL 5 MG/5ML PO SOLN
10.0000 mg | Freq: Once | ORAL | Status: AC
  Administered 2016-11-29: 10 mg via ORAL
  Filled 2016-11-28: qty 10

## 2016-11-28 MED ORDER — METOCLOPRAMIDE HCL 5 MG/5ML PO SOLN
10.0000 mg | Freq: Once | ORAL | Status: AC
  Administered 2016-11-28: 10 mg via ORAL
  Filled 2016-11-28: qty 10

## 2016-11-28 MED ORDER — SODIUM CHLORIDE 0.9 % IV SOLN
Freq: Once | INTRAVENOUS | Status: AC
  Administered 2016-11-29: 01:00:00 via INTRAVENOUS

## 2016-11-28 MED ORDER — SODIUM CHLORIDE 0.9 % IV BOLUS (SEPSIS)
1000.0000 mL | Freq: Once | INTRAVENOUS | Status: AC
  Administered 2016-11-28: 1000 mL via INTRAVENOUS

## 2016-11-28 MED ORDER — SODIUM CHLORIDE 0.9 % IV SOLN
INTRAVENOUS | Status: DC
  Administered 2016-11-28 – 2016-11-29 (×2): via INTRAVENOUS

## 2016-11-28 MED ORDER — SODIUM CHLORIDE 0.9 % IV SOLN
8.0000 mg/h | INTRAVENOUS | Status: DC
  Administered 2016-11-28: 8 mg/h via INTRAVENOUS
  Filled 2016-11-28: qty 80

## 2016-11-28 MED ORDER — ACETAMINOPHEN 325 MG PO TABS
650.0000 mg | ORAL_TABLET | Freq: Four times a day (QID) | ORAL | Status: DC | PRN
  Administered 2016-11-28: 650 mg via ORAL
  Filled 2016-11-28: qty 2

## 2016-11-28 MED ORDER — ACETAMINOPHEN 650 MG RE SUPP: 650.0000 mg | Freq: Four times a day (QID) | RECTAL | Status: DC | PRN

## 2016-11-28 MED ORDER — ONDANSETRON HCL 4 MG PO TABS: 4.0000 mg | ORAL_TABLET | Freq: Four times a day (QID) | ORAL | Status: DC | PRN

## 2016-11-28 MED ORDER — SODIUM CHLORIDE 0.9 % IV SOLN
80.0000 mg | Freq: Once | INTRAVENOUS | Status: AC
  Administered 2016-11-28: 80 mg via INTRAVENOUS
  Filled 2016-11-28: qty 80

## 2016-11-28 MED ORDER — POLYETHYLENE GLYCOL 3350 17 GM/SCOOP PO POWD
1.0000 | Freq: Once | ORAL | Status: AC
  Administered 2016-11-28: 255 g via ORAL
  Filled 2016-11-28: qty 255

## 2016-11-28 MED ORDER — PANTOPRAZOLE SODIUM 40 MG IV SOLR
40.0000 mg | Freq: Two times a day (BID) | INTRAVENOUS | Status: DC
  Administered 2016-11-28 – 2016-11-30 (×4): 40 mg via INTRAVENOUS
  Filled 2016-11-28 (×4): qty 40

## 2016-11-28 MED ORDER — ALBUTEROL SULFATE (2.5 MG/3ML) 0.083% IN NEBU: 2.5000 mg | INHALATION_SOLUTION | RESPIRATORY_TRACT | Status: DC | PRN

## 2016-11-28 MED ORDER — SODIUM CHLORIDE 0.9% FLUSH
3.0000 mL | Freq: Two times a day (BID) | INTRAVENOUS | Status: DC
  Administered 2016-11-28 – 2016-11-29 (×3): 3 mL via INTRAVENOUS

## 2016-11-28 NOTE — ED Notes (Signed)
Unable to complete orthostatic VS. While taking bp when pt was sitting on side of bed, pt stated "I feel very faint." Laid pt back in bed, adjusted head of bed and called Wille CelesteJanie, RN to make her aware of what was going on. MD Paduchowski made aware as well.

## 2016-11-28 NOTE — ED Notes (Signed)
Report given to [facility] 

## 2016-11-28 NOTE — ED Notes (Signed)
Pt resting in bed, daughter at bedside, aware of care link in route for transfer

## 2016-11-28 NOTE — ED Notes (Signed)
Pt aware of bed [facility] at [facility]

## 2016-11-28 NOTE — H&P (Signed)
      HISTORY AND PHYSICAL       PATIENT DETAILS Name: Cathy Clark Age: 56 y.o. Sex: female Date of Birth: 10/17/1960 Admit Date: 11/28/2016 PCP:Cook, Jayce G, DO   Patient coming from: Home   CHIEF COMPLAINT:  Painless rectal bleeding since Wednesday  HPI: Cathy Clark is a 56 y.o. female with medical history significant of hypertension, occasional headaches (takes Goody powders 3-4 times a week) presented to [facility] earlier today for evaluation of rectal bleeding. Per patient, rectal bleeding started this past Wednesday, on Wednesday she apparently had 4-5 episodes of painless rectal bleeding. Per patient-stools are mostly bloody-though on occasion they have been somewhat blackish as well. Although over the past day or so this has slowed down but still continues. Patient had only one hematochezia episode today. Patient has had episodes of lightheadedness over the past 2 days-this is mostly when she stands up and starts ambulating. This morning, she actually had a syncopal episode when she went to the bathroom. She subsequently was brought to the  [facility] ED, where she was found to have a hemoglobin of 7.8. She was given 1 unit of PRBC, and an transferred to [facility] for further evaluation and treatment.  Patient denies any fever, chest pain, nausea, vomiting or diarrhea.  Note: Lives at: Home Mobility:  Independent Chronic Indwelling Foley:no   REVIEW OF SYSTEMS:  Constitutional:   No  weight loss, night sweats,  Fevers, chills, fatigue.  HEENT:    No headaches, Dysphagia,Tooth/dental problems,Sore throat  Cardio-vascular: No chest pain,Orthopnea, PND,lower extremity edema, anasarca, palpitations  GI:  No heartburn, indigestion, abdominal pain, nausea, vomiting, diarrhea  Resp: No shortness of breath, cough, hemoptysis,plueritic chest pain.   Skin:  No rash or lesions.  GU:  No dysuria, change in color of urine, no  urgency or frequency.  No flank pain.  Musculoskeletal: No joint pain or swelling.  No decreased range of motion.  No back pain.  Endocrine: No heat intolerance, no cold intolerance, no polyuria, no polydipsia  Psych: No change in mood or affect. No depression or anxiety.  No memory loss.   ALLERGIES:   Allergies  Allergen Reactions  . Shrimp [Shellfish Allergy]   . Lisinopril Rash    PAST MEDICAL HISTORY: Past Medical History:  Diagnosis Date  . Hypertension     PAST SURGICAL HISTORY: Past Surgical History:  Procedure Laterality Date  . BREAST BIOPSY      MEDICATIONS AT HOME: Prior to Admission medications   Medication Sig Start Date End Date Taking? Authorizing Provider  cyclobenzaprine (FLEXERIL) 10 MG tablet Take 1 tablet (10 mg total) by mouth 3 (three) times daily as needed for muscle spasms. 09/16/16   Cook, Jayce G, DO  EPINEPHrine 0.3 mg/0.3 mL IJ SOAJ injection Inject 0.3 mLs (0.3 mg total) into the muscle once. 01/11/15   Gayle, Eryka A, MD  losartan (COZAAR) 50 MG tablet Take 1 tablet (50 mg total) by mouth daily. 10/01/16   Cook, Jayce G, DO    FAMILY HISTORY: Family History  Problem Relation Age of Onset  . BRCA 1/2 Mother     SOCIAL HISTORY:  reports that she has never smoked. She has never used smokeless tobacco. She reports that she does not drink alcohol or use drugs.  PHYSICAL EXAM: Blood pressure (!) 104/59, pulse 85, temperature 99.2 F (37.3 C), temperature source Oral, resp. rate 18, height 5' 5" (1.651 m), weight 61.2 kg (135 lb),   last menstrual period 12/13/2014, SpO2 100 %.  General appearance :Awake, alert, not in any distress.  Eyes:, pupils equally reactive to light and accomodation,no scleral icterus. HEENT: Atraumatic and Normocephalic Neck: supple, no JVD. No cervical lymphadenopathy.  Resp:Good air entry bilaterally, no added sounds  CVS: S1 S2 regular, no murmurs.  GI: Bowel sounds present, Non tender and not distended with no  gaurding, rigidity or rebound.No organomegaly Extremities: B/L Lower Ext shows no edema, both legs are warm to touch Neurology:  speech clear,Non focal, sensation is grossly intact. Psychiatric: Normal judgment and insight. Alert and oriented x 3. Normal mood. Musculoskeletal:gait appears to be normal.No digital cyanosis Skin:No Rash, warm and dry Wounds:N/A  LABS ON ADMISSION:  I have personally reviewed following labs and imaging studies  CBC:  Recent Labs Lab 11/28/16 0936  WBC 11.3*  HGB 7.8*  HCT 24.1*  MCV 94.8  PLT 465    Basic Metabolic Panel:  Recent Labs Lab 11/28/16 0936  NA 140  K 3.5  CL 108  CO2 24  GLUCOSE 143*  BUN 18  CREATININE 0.84  CALCIUM 8.8*    GFR: Estimated Creatinine Clearance: 68.9 mL/min (by C-G formula based on SCr of 0.84 mg/dL).  Liver Function Tests:  Recent Labs Lab 11/28/16 0936  AST 36  ALT 22  ALKPHOS 61  BILITOT 0.4  PROT 6.6  ALBUMIN 3.2*   No results for input(s): LIPASE, AMYLASE in the last 168 hours. No results for input(s): AMMONIA in the last 168 hours.  Coagulation Profile: No results for input(s): INR, PROTIME in the last 168 hours.  Cardiac Enzymes:  Recent Labs Lab 11/28/16 0936  TROPONINI <0.03    BNP (last 3 results) No results for input(s): PROBNP in the last 8760 hours.  HbA1C: No results for input(s): HGBA1C in the last 72 hours.  CBG: No results for input(s): GLUCAP in the last 168 hours.  Lipid Profile: No results for input(s): CHOL, HDL, LDLCALC, TRIG, CHOLHDL, LDLDIRECT in the last 72 hours.  Thyroid Function Tests: No results for input(s): TSH, T4TOTAL, FREET4, T3FREE, THYROIDAB in the last 72 hours.  Anemia Panel: No results for input(s): VITAMINB12, FOLATE, FERRITIN, TIBC, IRON, RETICCTPCT in the last 72 hours.  Urine analysis: No results found for: COLORURINE, APPEARANCEUR, LABSPEC, PHURINE, GLUCOSEU, HGBUR, BILIRUBINUR, KETONESUR, PROTEINUR, UROBILINOGEN, NITRITE,  LEUKOCYTESUR  Sepsis Labs: Lactic Acid, Venous No results found for: [place]   Microbiology: No results found for this or any previous visit (from the past 240 hour(s)).    RADIOLOGIC STUDIES ON ADMISSION: No results found.   EKG:  Personally reviewed. NSR  ASSESSMENT AND PLAN: GI bleeding with acute blood loss anemia: Given history-this sounds more of a lower GI bleed-although some dark stool is mentioned by patient. She does take [employer]. Will keep nothing by mouth until GI evaluation is obtained, but suspect will require endoscopy evaluation-will place on PPI twice a day given history of possible melena. Repeat CBC now-she already received a unit of blood at [facility]. Follow CBC and transfuse accordingly. If she has significant GI bleeding or hemodynamic instability, may need to pursue tagged RBC scan and IR consult for embolization.  Syncope/lightheadedness: Likely due to orthostatic mechanism. Follow.  Hypertension: Hold losartan.  Further plan will depend as patient's clinical course evolves and further radiologic and laboratory data become available. Patient will be monitored closely.  Above noted plan was discussed with patient/family  face to face at bedside, they were in agreement.   CONSULTS: GI  DVT Prophylaxis: SCD's  Code Status: Full Code  Disposition Plan:  Discharge back home possibly in 2-3 days, depending on clinical course  Admission status: Inpatient going to tele  The medical decision making on this patient was of high complexity and the patient is at high risk for clinical deterioration, therefore this is a level 3 visit.  Total time spent  55 minutes.Greater than 50% of this time was spent in counseling, explanation of diagnosis, planning of further management, and coordination of care.  Oren Binet [facility] Pager [phone]  If 7PM-7AM, please contact night-coverage [email or web address] Password [facility] 11/28/2016, 4:32  PM

## 2016-11-28 NOTE — Progress Notes (Signed)
CRITICAL VALUE ALERT  Critical Value: Hgb. 6.8 Date & Time Notied: 11/28/16 2223 Provider Notified: Dr. Maryfrances Bunnellanford Orders Received/Actions taken: orders given

## 2016-11-28 NOTE — ED Notes (Signed)
Pt resting in bed, attempted to call report to [place], states the RN will return my call, family at bedside

## 2016-11-28 NOTE — Consult Note (Signed)
[facility] Consult: 4:36 PM 11/28/2016  LOS: 0 days    Referring Provider: Dr Sloan Leiter  Primary Care Physician:  Coral Spikes, DO Primary Gastroenterologist:  unassigned   Reason for Consultation:  GI bleed, anemia   HPI: Cathy Clark is a 56 y.o. female.  History of hypertension and headaches. History of miscarriage and associated blood loss anemia at the time.  Patient has never undergone upper endoscopy or colonoscopy. She has stable every other day brown stools no prior history of GI bleeding. She doesn't suffer much in the way of heartburn symptoms.  Starting Wednesday 6/13 she had several frankly bloody bowel movements probably about 4 or 5 episodes in total. She started to feel weak and dizzy. She had a single episode of hematochezia/dark stools yesterday and she's had one or 2 today. Today she had a syncopal spell when she was in the bathroom. She did not hit her head. Her family brought her to the [facility] emergency room where GI coverage was not available so she was transferred to [facility] this afternoon.  Hgb 7.8, it measured 12 in early 09/2016, approximately 9 weeks ago.  MCV 94.  Platelets are fine. BUN is normal.  EMS reported blood pressure of 99/60 and heart rate of 80.   Patient denies rectal pain, abdominal pain. She did have a little episode of nausea this morning when she was riding to the ED, she never threw up.  She takes about a total of 3 Goody's powders per week for management of headaches. 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 She normally has excellent energy and no limitations to activity. Her weight is stable she has a good appetite.  Doesn't consume alcoholic beverages No family history of colorectal cancer or GI bleeding. No family  history of anemia. Her mother did have breast cancer.     Past Medical History:  Diagnosis Date  . Hypertension     Past Surgical History:  Procedure Laterality Date  . BREAST BIOPSY      Prior to Admission medications   Medication Sig Start Date End Date Taking? Authorizing Provider  cyclobenzaprine (FLEXERIL) 10 MG tablet Take 1 tablet (10 mg total) by mouth 3 (three) times daily as needed for muscle spasms. 09/16/16   Coral Spikes, DO  EPINEPHrine 0.3 mg/0.3 mL IJ SOAJ injection Inject 0.3 mLs (0.3 mg total) into the muscle once. 01/11/15   Joanne Gavel, MD  losartan (COZAAR) 50 MG tablet Take 1 tablet (50 mg total) by mouth daily. 10/01/16   Coral Spikes, DO    Scheduled Meds:  Infusions:  PRN Meds:    Allergies as of 11/28/2016 - Review Complete 11/28/2016  Allergen Reaction Noted  . Shrimp [shellfish allergy]  01/11/2015  . Lisinopril Rash 09/16/2016    Family History  Problem Relation Age of Onset  . BRCA 1/2 Mother     Social History   Social History  . Marital status: Married   Social History Main Topics  . Smoking status: Never  Smoker  . Smokeless tobacco: Never Used  . Alcohol use No  . Drug use: No  . Sexual activity: Not Currently    Partners: Male     REVIEW OF SYSTEMS: Constitutional:  Per HPI ENT:  No nose bleeds Pulm:  No shortness of breath. No cough. CV:  No palpitations, no LE edema.  GU:  No hematuria, no frequency GI:  Per HPI Heme:  Generally has no unusual bleeding or bruising   Transfusions:  Does not recall previous transfusions. Neuro:  + headaches, no peripheral tingling or numbness Derm:  No itching, no rash or sores.  Endocrine:  No sweats or chills.  No polyuria or dysuria Immunization:  Did not inquire as to recent vaccination Travel:  None beyond local counties in last few months.    PHYSICAL EXAM: Vital signs in last 24 hours: Vitals:   11/28/16 1600  BP: (!) 104/59  Pulse: 85  Resp: 18  Temp: 99.2 F  (37.3 C)   Wt Readings from Last 3 Encounters:  11/28/16 61.2 kg (135 lb)  11/28/16 63.5 kg (140 lb)  09/16/16 66.2 kg (146 lb)    General: Pleasant, petite AAF. She is comfortable and looks well. Head:  No facial asymmetry or swelling. No signs of head trauma.  Eyes:  No scleral icterus. No conjunctival pallor. EOMI. Ears:  Not hard of hearing.  Nose:  No congestion or discharge. Mouth:  Oropharynx is moist and clear. Tongue midline. Neck:  No JVD, no masses, no thyromegaly. Lungs:  Clear bilaterally. Good breath sounds. No dyspnea or cough. Heart: RRR. No MRG. S1, S2 present. Abdomen:  Soft. Not tender or distended. No bruits, hernias, masses, organomegaly..   Rectal: Very deep burgundy stool no rectal masses. No rectal tenderness.   Musc/Skeltl: No gross joint deformities, or swelling. Extremities:  No CCE.  Neurologic:  Fully oriented and alert. Moves all 4 limbs. Full strength. No tremor. Skin:  No rashes, no sores, no itching. Tattoos:  None visualized. Nodes:  No cervical adenopathy.   Psych:  In good spirits, cooperative, calm  Intake/Output from previous day: No intake/output data recorded. Intake/Output this shift: No intake/output data recorded.  LAB RESULTS:  Recent Labs  11/28/16 0936  WBC 11.3*  HGB 7.8*  HCT 24.1*  PLT 186   BMET Lab Results  Component Value Date   NA 140 11/28/2016   NA 138 10/09/2016   NA 137 09/16/2016   K 3.5 11/28/2016   K 4.0 10/09/2016   K 3.7 09/16/2016   CL 108 11/28/2016   CL 105 10/09/2016   CL 103 09/16/2016   CO2 24 11/28/2016   CO2 29 10/09/2016   CO2 28 09/16/2016   GLUCOSE 143 (H) 11/28/2016   GLUCOSE 81 10/09/2016   GLUCOSE 85 09/16/2016   BUN 18 11/28/2016   BUN 12 10/09/2016   BUN 11 09/16/2016   CREATININE 0.84 11/28/2016   CREATININE 0.83 10/09/2016   CREATININE 0.83 09/16/2016   CALCIUM 8.8 (L) 11/28/2016   CALCIUM 9.6 10/09/2016   CALCIUM 9.4 09/16/2016   LFT  Recent Labs  11/28/16 0936    PROT 6.6  ALBUMIN 3.2*  AST 36  ALT 22  ALKPHOS 61  BILITOT 0.4     IMPRESSION:   *  Painless hematochezia. Rule out diverticular bleed. Rule out bleeding ulcers from her use of aspirin powders.. Rule out neoplasia.  *  Syncope associated with acute GI blood loss.  *  Normocytic anemia due to acute  blood loss. Presenting hemoglobin represents 3 g drop from her baseline 2 months ago.     PLAN:     *  Patient will undergo upper endoscopy as well as colonoscopy tomorrow with Dr. Carol Ada. Dr. Carlean Purl will be seeing the patient this evening but Dr. Benson Norway is on call for the weekend.   Azucena Freed  11/28/2016, 4:36 PM Pager: [phone]  The patient has painless hematochezia suggestive of diverticular bleeding but other possibilities exist. Colonoscopy and possible upper endoscopy but we performed by Dr. Benson Norway tomorrow. Supportive care through the hospitalist.The risks and benefits as well as alternatives of endoscopic procedure(s) have been discussed and reviewed. All questions answered. The patient agrees to proceed.   Gatha Mayer, MD, [facility] [phone] (pager) [phone] after 5 PM, weekends and holidays  11/28/2016 7:49 PM

## 2016-11-28 NOTE — ED Notes (Signed)
Transfer consent signed on paper

## 2016-11-28 NOTE — ED Provider Notes (Addendum)
[facility] Emergency Department Provider Note  Time seen: 9:18 AM  I have reviewed the triage vital signs and the nursing notes.   HISTORY  Chief Complaint No chief complaint on file.    HPI Cathy Clark is a 56 y.o. female with a past medical history of hypertension presents to the emergency Department after syncopal episode as well as rectal bleeding. According to the patient for the past 3 days she has been experiencing rectal bleeding which she describes as dark stool with occasional bright red stool when she wipes. Denies any history of rectal bleeding past. Denies any iron or Pepto-Bismol use. Patient states this morning she was using the restroom when she stood up to leave the bathroom had a syncopal episode. Denies any history of syncope in the past. Denies any chest pain or trouble breathing. Currently the patient states she feels well, feels back to normal. Denies any abdominal pain at any point. Denies any nausea or vomiting.  Past Medical History:  Diagnosis Date  . Hypertension     Patient Active Problem List   Diagnosis Date Noted  . Essential hypertension 09/16/2016  . Muscle spasm 09/16/2016  . Carpal tunnel syndrome 09/16/2016    Past Surgical History:  Procedure Laterality Date  . BREAST BIOPSY      Prior to Admission medications   Medication Sig Start Date End Date Taking? Authorizing Provider  cyclobenzaprine (FLEXERIL) 10 MG tablet Take 1 tablet (10 mg total) by mouth 3 (three) times daily as needed for muscle spasms. 09/16/16   Coral Spikes, DO  EPINEPHrine 0.3 mg/0.3 mL IJ SOAJ injection Inject 0.3 mLs (0.3 mg total) into the muscle once. 01/11/15   Joanne Gavel, MD  losartan (COZAAR) 50 MG tablet Take 1 tablet (50 mg total) by mouth daily. 10/01/16   Coral Spikes, DO    Allergies  Allergen Reactions  . Shrimp [Shellfish Allergy]   . Lisinopril Rash    Family History  Problem Relation Age of Onset  . BRCA 1/2 Mother      Social History Social History  Substance Use Topics  . Smoking status: Never Smoker  . Smokeless tobacco: Never Used  . Alcohol use No    Review of Systems Constitutional: Negative for fever Cardiovascular: Negative for chest pain. Respiratory: Negative for shortness of breath. Gastrointestinal: Negative for abdominal pain. Positive for loose dark stool with occasional bright red blood. Genitourinary: Negative for dysuria. Negative for hematuria Musculoskeletal: Negative for back pain. Skin: Negative for rash. Neurological: Negative for headache All other ROS negative  ____________________________________________   PHYSICAL EXAM:  Constitutional: Alert and oriented. Well appearing and in no distress. Eyes: Normal exam ENT   Head: Normocephalic and atraumatic.   Mouth/Throat: Mucous membranes are moist. Cardiovascular: Normal rate, regular rhythm. No murmur Respiratory: Normal respiratory effort without tachypnea nor retractions. Breath sounds are clear  Gastrointestinal: Soft and nontender. No distention.  Rectal examination shows no hemorrhoids, nontender, dark stool that is strongly guaiac positive. Musculoskeletal: Nontender with normal range of motion in all extremities.  Neurologic:  Normal speech and language. No gross focal neurologic deficits Skin:  Skin is warm, dry and intact.  Psychiatric: Mood and affect are normal.   ____________________________________________    EKG  EKG reviewed and interpreted by myself shows normal sinus rhythm at 81 bpm, narrow QRS, normal axis, normal intervals, no ST changes. Normal EKG.  ____________________________________________   INITIAL IMPRESSION / ASSESSMENT AND PLAN / ED COURSE  Pertinent  labs & imaging results that were available during my care of the patient were reviewed by me and considered in my medical decision making (see chart for details).  The patient presents the emergency department for  syncopal episode, dark stool for the past 2-3 days. On exam the patient has a completely nontender abdomen, rectal exam is nontender with no hemorrhoids, dark stool that is strongly guaiac positive. We will check an EKG, labs, type and screen. Given melena on rectal exam was syncopal episode today I anticipate likely admission to the hospital for further treatment.  Patient's hemoglobin is currently 7.8 decreased from 12 in April. Given the patient's acute decrease in hemoglobin with acute GI bleed we will transfuse with 1 unit of packed red blood cells. We'll start the patient on a Protonix bolus and infusion and admitted to the hospital for further treatment. I have consented the patient for blood products. Patient is agreeable to this plan.  CRITICAL CARE Performed by: Harvest Dark   Total critical care time: 30 minutes  Critical care time was exclusive of separately billable procedures and treating other patients.  Critical care was necessary to treat or prevent imminent or life-threatening deterioration.  Critical care was time spent personally by me on the following activities: development of treatment plan with patient and/or surrogate as well as nursing, discussions with consultants, evaluation of patient's response to treatment, examination of patient, obtaining history from patient or surrogate, ordering and performing treatments and interventions, ordering and review of laboratory studies, ordering and review of radiographic studies, pulse oximetry and re-evaluation of patient's condition.   Unfortunately we do not currently have GI medicine coverage. Orthostatic vitals were not able to be performed as the patient became nauseated and very lightheaded just upon sitting up. Given the patient's active GI bleeding and we will transfer to [facility] for further treatment. Currently awaiting to speak to [facility] for transfer.  Patient has been accepted to [facility] by GI  medicine as well as the hospitalist. However the patient's daughter has just arrived to the emergency department and wishes for the patient to go to [facility]. The patient now wishes to go to [facility] as well. We will cancel [facility] transfer and attempt to transfer to [facility].  [facility] is currently full as far as MedSurg beds. Patient would be #17 on a wait list. I discussed this with the patient they will maintain the position at [facility] for transfer.  ____________________________________________   FINAL CLINICAL IMPRESSION(S) / ED DIAGNOSES  Syncope Symptomatic anemia GI bleed   Harvest Dark, MD 11/28/16 1030    Harvest Dark, MD 11/28/16 1138

## 2016-11-28 NOTE — ED Notes (Signed)
[facility] at bedside for transport  

## 2016-11-28 NOTE — Progress Notes (Signed)
  Cathy Clark is a 56 y/o F with pmh of HTN, who presents with complaints of dark stools for the last 3 days. Hemoglobin dropped from 12 on 4/13 down to 7.8 today. Guaiac stools positive. Dr. Leone PayorGessner of GI was notified reports he will see patient in consultation. Protonix gtt started and IV fluids. Admitted to a MedSurg bed

## 2016-11-28 NOTE — ED Notes (Signed)
Pt resting in bed, resp even and unlabored, states feeling lightheaded, warm blanket given, EDP made pt aware of need to transfer for GI

## 2016-11-28 NOTE — Progress Notes (Addendum)
Report received from [facility]. Pt arrive to the unit via strecher at 1530.

## 2016-11-28 NOTE — Progress Notes (Signed)
Cathy Clark is a 56 y.o. female patient admitted from [facility] ED awake, alert - oriented  X 4 - no acute distress noted.  VSS - Blood pressure (!) 104/59, pulse 85, temperature 99.2 F (37.3 C), temperature source Oral, resp. rate 18, height 5\' 5"  (1.651 m), weight 61.2 kg (135 lb), last menstrual period 12/13/2014, SpO2 100 %.    IV in place, occlusive dsg intact without redness.  Orientation to room, and floor completed with information packet given to patient/family.  Patient declined safety video at this time.  Admission INP armband ID verified with patient/family, and in place.   SR up x 2, fall assessment complete, with patient and family able to verbalize understanding of risk associated with falls, and verbalized understanding to call nsg before up out of bed.  Call light within reach, patient able to voice, and demonstrate understanding.  Skin, clean-dry- intact without evidence of bruising, or skin tears.   No evidence of skin break down noted on exam.   Will cont to eval and treat per MD orders.  Cathy NeedlesIreti O Levar Fayson, RN 11/28/2016 6:18 PM

## 2016-11-28 NOTE — ED Triage Notes (Signed)
Pt arrived via EMS from home for reports of rectal bleeding for three days and syncopal episode today. EMS reports 99/60, 80HR, CBG 165. Pt alert and oriented on arrival. Denies pain.

## 2016-11-29 ENCOUNTER — Encounter (HOSPITAL_COMMUNITY): Payer: Self-pay | Admitting: Certified Registered Nurse Anesthetist

## 2016-11-29 ENCOUNTER — Encounter (HOSPITAL_COMMUNITY): Payer: Self-pay

## 2016-11-29 ENCOUNTER — Encounter (HOSPITAL_COMMUNITY)
Admission: AD | Disposition: A | Discharge status: Other Acute Inpatient Hospital | Payer: Self-pay | Source: Other Acute Inpatient Hospital | Attending: Internal Medicine

## 2016-11-29 DIAGNOSIS — K2971 Gastritis, unspecified, with bleeding: Secondary | ICD-10-CM | POA: Diagnosis not present

## 2016-11-29 DIAGNOSIS — I10 Essential (primary) hypertension: Secondary | ICD-10-CM

## 2016-11-29 HISTORY — PX: COLONOSCOPY: SHX5424

## 2016-11-29 HISTORY — PX: ESOPHAGOGASTRODUODENOSCOPY: SHX5428

## 2016-11-29 LAB — BPAM RBC
BLOOD PRODUCT EXPIRATION DATE: 201807052359
ISSUE DATE / TIME: 201806151122
UNIT TYPE AND RH: 5100

## 2016-11-29 LAB — BASIC METABOLIC PANEL
ANION GAP: 5 (ref 5–15)
ANION GAP: 3 — AB (ref 5–15)
BUN: 11 mg/dL (ref 6–20)
BUN: 8 mg/dL (ref 6–20)
CALCIUM: 7.9 mg/dL — AB (ref 8.9–10.3)
CALCIUM: 7.3 mg/dL — AB (ref 8.9–10.3)
CO2: 23 mmol/L (ref 22–32)
CO2: 23 mmol/L (ref 22–32)
CREATININE: 0.86 mg/dL (ref 0.44–1.00)
CREATININE: 0.75 mg/dL (ref 0.44–1.00)
Chloride: 107 mmol/L (ref 101–111)
Chloride: 109 mmol/L (ref 101–111)
GFR calc Af Amer: >60 mL/min (ref >60)
GLUCOSE: 125 mg/dL — AB (ref 65–99)
Glucose, Bld: 133 mg/dL — ABNORMAL HIGH (ref 65–99)
Potassium: 3.5 mmol/L (ref 3.5–5.1)
Potassium: 3.3 mmol/L — ABNORMAL LOW (ref 3.5–5.1)
Sodium: 135 mmol/L (ref 135–145)
Sodium: 135 mmol/L (ref 135–145)

## 2016-11-29 LAB — CBC
HCT: 21.1 % — ABNORMAL LOW (ref 36.0–46.0)
HEMATOCRIT: 19.1 % — AB (ref 36.0–46.0)
HEMOGLOBIN: 6.9 g/dL — AB (ref 12.0–15.0)
HEMOGLOBIN: 6.3 g/dL — AB (ref 12.0–15.0)
MCH: 30.1 pg (ref 26.0–34.0)
MCH: 29.6 pg (ref 26.0–34.0)
MCHC: 32.7 g/dL (ref 30.0–36.0)
MCHC: 33 g/dL (ref 30.0–36.0)
MCV: 92.1 fL (ref 78.0–100.0)
MCV: 89.7 fL (ref 78.0–100.0)
PLATELETS: 142 10*3/uL — AB (ref 150–400)
Platelets: 113 10*3/uL — ABNORMAL LOW (ref 150–400)
RBC: 2.29 MIL/uL — AB (ref 3.87–5.11)
RBC: 2.13 MIL/uL — AB (ref 3.87–5.11)
RDW: 15 % (ref 11.5–15.5)
RDW: 15.7 % — ABNORMAL HIGH (ref 11.5–15.5)
WBC: 9.8 10*3/uL (ref 4.0–10.5)
WBC: 14.1 10*3/uL — AB (ref 4.0–10.5)

## 2016-11-29 LAB — TYPE AND SCREEN
ABO/RH(D): O POS
Antibody Screen: NEGATIVE
Unit division: 0

## 2016-11-29 LAB — ABO/RH: ABO/RH(D): O POS

## 2016-11-29 LAB — PREPARE RBC (CROSSMATCH)

## 2016-11-29 LAB — PROTIME-INR
INR: 1.29
PROTHROMBIN TIME: 16.1 s — AB (ref 11.4–15.2)

## 2016-11-29 SURGERY — EGD (ESOPHAGOGASTRODUODENOSCOPY)
Anesthesia: Monitor Anesthesia Care

## 2016-11-29 MED ORDER — FENTANYL CITRATE (PF) 100 MCG/2ML IJ SOLN
INTRAMUSCULAR | Status: DC | PRN
  Administered 2016-11-29 (×2): 25 ug via INTRAVENOUS

## 2016-11-29 MED ORDER — MIDAZOLAM HCL 5 MG/ML IJ SOLN
INTRAMUSCULAR | Status: AC
  Filled 2016-11-29: qty 2

## 2016-11-29 MED ORDER — FENTANYL CITRATE (PF) 100 MCG/2ML IJ SOLN
INTRAMUSCULAR | Status: AC
  Filled 2016-11-29: qty 4

## 2016-11-29 MED ORDER — MIDAZOLAM HCL 10 MG/2ML IJ SOLN
INTRAMUSCULAR | Status: DC | PRN
  Administered 2016-11-29: 2 mg via INTRAVENOUS
  Administered 2016-11-29 (×3): 1 mg via INTRAVENOUS

## 2016-11-29 MED ORDER — SODIUM CHLORIDE 0.9 % IV SOLN
INTRAVENOUS | Status: DC
  Administered 2016-11-29 – 2016-11-30 (×2): via INTRAVENOUS
  Administered 2016-11-30: 1000 mL via INTRAVENOUS

## 2016-11-29 MED ORDER — SODIUM CHLORIDE 0.9 % IV SOLN
Freq: Once | INTRAVENOUS | Status: AC
  Administered 2016-11-29: 250 mL via INTRAVENOUS

## 2016-11-29 MED ORDER — SODIUM CHLORIDE 0.9 % IV SOLN
INTRAVENOUS | Status: AC
  Administered 2016-11-29: 1000 mL via INTRAVENOUS

## 2016-11-29 MED ORDER — DIPHENHYDRAMINE HCL 50 MG/ML IJ SOLN
INTRAMUSCULAR | Status: AC
  Filled 2016-11-29: qty 1

## 2016-11-29 NOTE — Progress Notes (Addendum)
Hemoglobin 6.3, [name], Potassium 3.3. MD notified. Awaiting orders.

## 2016-11-29 NOTE — Progress Notes (Signed)
   11/29/16 0110  What Happened  Was fall witnessed? Yes  Who witnessed fall? Cathy KoyanagiJoyce G. Naphtali Clark  Patients activity before fall ambulating-assisted  Point of contact buttocks;other (comment) (assisted pt. to floor)  Was patient injured? No  Follow Up  MD notified Dr. Maryfrances Bunnellanford  Time MD notified [id number]  Family notified Yes-comment (daughter present)  Time family notified 0110  Additional tests No  Adult Fall Risk Assessment  Risk Factor Category (scoring not indicated) High fall risk per protocol (document High fall risk)  Patient's Fall Risk High Fall Risk (>13 points)  Adult Fall Risk Interventions  Required Bundle Interventions *See Row Information* High fall risk - low, moderate, and high requirements implemented  Additional Interventions Family Supervision;Use of appropriate toileting equipment (bedpan, BSC, etc.)  Screening for Fall Injury Risk  Risk For Fall Injury- See Row Information  None identified  Pain Assessment  Pain Assessment 0-10  Pain Score 0  Neurological  Neuro (WDL) X  Level of Consciousness Alert  Orientation Level Oriented X4 (feeling dizzy)  Cognition Appropriate at baseline  Speech Clear  Pupil Assessment  Yes  R Pupil Size (mm) 3  R Pupil Shape Round  R Pupil Reaction Brisk  L Pupil Size (mm) 3  L Pupil Shape Round  L Pupil Reaction Brisk  Additional Pupil Assessments No  Neuro Symptoms Other (Comment) (dizzy)  Musculoskeletal  Musculoskeletal (WDL) X  Generalized Weakness Yes  Weight Bearing Restrictions No  Integumentary  Integumentary (WDL) WDL  and VS- 99.6 (O) 93/52,  73, 16; offered BSC and pt. wanted to walk to bathroom.

## 2016-11-29 NOTE — Progress Notes (Signed)
Pt. only drank 1/2 of the 32oz. gatorade with Miralax started at 0255; pt. did have a large watery dark red stool around 0120.

## 2016-11-29 NOTE — Progress Notes (Signed)
Per MD order, will continue fluids NS@100cc /hr. Will continue to monitor.

## 2016-11-29 NOTE — Progress Notes (Signed)
Patient back from ENDO. Alert and oriented x 4; no complaints of pain or other distress; 1st unit of blood was finished in ENDO. Documentation done by Endo RN. Will continue to monitor.

## 2016-11-29 NOTE — Op Note (Signed)
[facility] Patient Name: Cathy Clark Procedure Date : 11/29/2016 MRN: [id number] Attending MD: Jeani HawkingPatrick Mintie Witherington , MD Date of Birth: 01/11/1961 CSN: [id number] Age: 56 Admit Type: Inpatient Procedure:                Upper GI endoscopy Indications:              Hematochezia Providers:                Jeani HawkingPatrick Che Rachal, MD, Priscella MannAutumn Goldsmith, RN, Jacqulyn LinerAlexis                            Powell, Technician Referring MD:              Medicines:                See the colonoscopy report. Complications:            No immediate complications. Estimated Blood Loss:     Estimated blood loss was minimal. Procedure:                Pre-Anesthesia Assessment:                           - Prior to the procedure, a History and Physical                            was performed, and patient medications and                            allergies were reviewed. The patient's tolerance of                            previous anesthesia was also reviewed. The risks                            and benefits of the procedure and the sedation                            options and risks were discussed with the patient.                            All questions were answered, and informed consent                            was obtained. Prior Anticoagulants: The patient has                            taken no previous anticoagulant or antiplatelet                            agents. ASA Grade Assessment: II - A patient with                            mild systemic disease. After reviewing the risks  and benefits, the patient was deemed in                            satisfactory condition to undergo the procedure.                           - Sedation was administered by an endoscopy nurse.                            The sedation level attained was moderate.                           After obtaining informed consent, the endoscope was                            passed under direct vision.  Throughout the                            procedure, the patient's blood pressure, pulse, and                            oxygen saturations were monitored continuously. The                            EC-3490LI ([id number]) scope was introduced through                            the mouth, and advanced to the second part of                            duodenum. The upper GI endoscopy was accomplished                            without difficulty. The patient tolerated the                            procedure well. Scope In: Scope Out: Findings:      The esophagus was normal.      Two non-bleeding cratered, linear and superficial gastric ulcers with a       visible vessel were found on the lesser curvature of the stomach and in       the gastric antrum. The largest lesion was 10 mm in largest dimension.       Fulguration to stop the bleeding by bipolar probe was successful.       Estimated blood loss: none. To stop active bleeding, one hemostatic clip       was successfully placed (MR unsafe). There was no bleeding at the end of       the procedure. Biopsies were taken with a cold forceps for histology.      The examined duodenum was normal.      In the antrum and the lesser curvature of the stomach there were two       large superficial ulcerations with stigmata of bleeding. The lesser       curvature ulcer exhibited the possibility of a visible vessel and  there       was some fresh blood. BICAP was applied to both ulcers and the lesser       curvature ulcer had persistent oozing. A hemoclip was successfully       deployed and the bleeding was arrested. Impression:               - Normal esophagus.                           - Non-bleeding gastric ulcers with a visible                            vessel. Treated with bipolar cautery. Clip (MR                            unsafe) was placed. Biopsied.                           - Normal examined duodenum. Moderate Sedation:      Moderate  (conscious) sedation was administered by the endoscopy nurse       and supervised by the endoscopist. The following parameters were       monitored: oxygen saturation, heart rate, blood pressure, and response       to care. Recommendation:           - Return patient to hospital ward for ongoing care.                           - Clear liquid diet.                           - Continue present medications.                           - Await pathology results.                           - No NSAIDS, i.e., Ibuprofen, Goody's Powder,                            aspirin, etc. Procedure Code(s):        --- Professional ---                           43255, 59, Esophagogastroduodenoscopy, flexible,                            transoral; with control of bleeding, any method                           43239, Esophagogastroduodenoscopy, flexible,                            transoral; with biopsy, single or multiple Diagnosis Code(s):        --- Professional ---                           K25.4, Chronic or unspecified  gastric ulcer with                            hemorrhage                           K92.1, Melena (includes Hematochezia) CPT copyright 2016 American Medical Association. All rights reserved. The codes documented in this report are preliminary and upon coder review may  be revised to meet current compliance requirements. Jeani Hawking, MD Jeani Hawking, MD 11/29/2016 2:00:50 PM This report has been signed electronically. Number of Addenda: 0

## 2016-11-29 NOTE — Plan of Care (Signed)
Problem: Education: Goal: Knowledge of [facility] Education information/materials will improve Outcome: Progressing POC reviewed with pt.; pt. aware that she's to have colonoscopy and endoscopy; and to receive blood.

## 2016-11-29 NOTE — Progress Notes (Signed)
PROGRESS NOTE    Cathy Clark  ZOX:[id number] DOB: 05/21/1961 DOA: 11/28/2016 PCP: Tommie Samsook, Jayce G, DO  Brief Narrative: 56 year old female with history of hypertension, occasional headaches uses Goody powders 2 times a week admitted with multiple episodes of bright red blood per rectum occasionally mixed with darker stools for 3-4 days Hemoglobin 7.8 outside hospital transfused 1 unit of PRBC and [facility]   Assessment & Plan:   1. Lower GI bleeding -Suspected per history however unable to rule out brisk upper GI bleed at this time -Continue IV protonix -GI consulting, plan for EGD and colonoscopy today -Depending on clinical course if she has active/ongoing bleeding will not need a tagged RBC scan etc.  2. Acute blood loss anemia -Secondary to above, transfuse units PRBCs yesterday -Will need 2 more units of blood today -Monitor CBC  3.  Essential hypertension -BP soft, holding ARB  4. Mild thombocytopenia -This is acute suspect related to consumption  DVT prophylaxis : SCDs   Code Status:  full code  Family Communication: daughter at bedside  Disposition Plan: Home pending stabilization   Consultants:   [facility]   Subjective: Had another episode of bright red blood per rectum mixed with stools  Objective: Vitals:   11/29/16 1330 11/29/16 1335 11/29/16 1340 11/29/16 1354  BP: (!) 95/49 (!) 95/52 104/60 (!) 92/59  Pulse: 71 72 74 82  Resp: 15 20 16 17   Temp:    97.9 F (36.6 C)  TempSrc:    Oral  SpO2: 100% 100% 100% 100%  Weight:      Height:        Intake/Output Summary (Last 24 hours) at 11/29/16 1404 Last data filed at 11/29/16 1112  Gross per 24 hour  Intake             1601 ml  Output                0 ml  Net             1601 ml   Filed Weights   11/28/16 1600  Weight: 61.2 kg (135 lb)    Examination:  General exam: Thinly built female, sitting in bed, no distress. Cardiovascular system: S1 & S2 heard, RRR. No  JVD, murmur Gastrointestinal system: Abdomen is nondistended, soft and nontender. Normal bowel sounds heard. Central nervous system: Alert and oriented. No focal neurological deficits. Extremities: Symmetric 5 x 5 power. Skin: No rashes, lesions or ulcers Psychiatry: Judgement and insight appear normal. Mood & affect appropriate.     Data Reviewed:   CBC:  Recent Labs Lab 11/28/16 0936 11/28/16 1644 11/28/16 2143 11/29/16 0642  WBC 11.3* 11.7* 9.8 9.8  HGB 7.8* 7.5* 6.8* 6.9*  HCT 24.1* 22.7* 20.5* 21.1*  MCV 94.8 92.3 91.9 92.1  PLT 186 160 150 142*   Basic Metabolic Panel:  Recent Labs Lab 11/28/16 0936 11/29/16 0642  NA 140 135  K 3.5 3.5  CL 108 107  CO2 24 23  GLUCOSE 143* 133*  BUN 18 11  CREATININE 0.84 0.86  CALCIUM 8.8* 7.9*   GFR: Estimated Creatinine Clearance: 67.3 mL/min (by C-G formula based on SCr of 0.86 mg/dL). Liver Function Tests:  Recent Labs Lab 11/28/16 0936  AST 36  ALT 22  ALKPHOS 61  BILITOT 0.4  PROT 6.6  ALBUMIN 3.2*   No results for input(s): LIPASE, AMYLASE in the last 168 hours. No results for input(s): AMMONIA in the last 168 hours. Coagulation  Profile:  Recent Labs Lab 11/29/16 0642  INR 1.29   Cardiac Enzymes:  Recent Labs Lab 11/28/16 0936  TROPONINI <0.03   BNP (last 3 results) No results for input(s): PROBNP in the last 8760 hours. HbA1C: No results for input(s): HGBA1C in the last 72 hours. CBG: No results for input(s): GLUCAP in the last 168 hours. Lipid Profile: No results for input(s): CHOL, HDL, LDLCALC, TRIG, CHOLHDL, LDLDIRECT in the last 72 hours. Thyroid Function Tests: No results for input(s): TSH, T4TOTAL, FREET4, T3FREE, THYROIDAB in the last 72 hours. Anemia Panel: No results for input(s): VITAMINB12, FOLATE, FERRITIN, TIBC, IRON, RETICCTPCT in the last 72 hours. Urine analysis: No results found for: COLORURINE, APPEARANCEUR, LABSPEC, PHURINE, GLUCOSEU, HGBUR, BILIRUBINUR, KETONESUR,  PROTEINUR, UROBILINOGEN, NITRITE, LEUKOCYTESUR Sepsis Labs: @LABRCNTIP (procalcitonin:4,lacticidven:4)  )No results found for this or any previous visit (from the past 240 hour(s)).       Radiology Studies: No results found.      Scheduled Meds: . [MAR Hold] pantoprazole (PROTONIX) IV  40 mg Intravenous Q12H  . [MAR Hold] sodium chloride flush  3 mL Intravenous Q12H   Continuous Infusions: . sodium chloride 500 mL (11/29/16 1117)     LOS: 1 day    Time spent:    Zannie Cove, MD [facility] Pager [phone]  If 7PM-7AM, please contact night-coverage [email or web address] Password [email or web address] 11/29/2016, 2:04 PM

## 2016-11-29 NOTE — Progress Notes (Signed)
NT informed RN that patient's BP dropped to 77/47 [facility] When back in bed her BO came up to 89/60 and patient was complaining of being dizzy. MD informed. Will continue to monitor.

## 2016-11-29 NOTE — Op Note (Signed)
[facility] Patient Name: Cathy Clark Procedure Date : 11/29/2016 MRN: [id number] Attending MD: Jeani Hawking , MD Date of Birth: 06-16-1961 CSN: [id number] Age: 56 Admit Type: Inpatient Procedure:                Colonoscopy Indications:              Hematochezia Providers:                Jeani Hawking, MD, Priscella Mann, RN, Jacqulyn Liner, Technician Referring MD:              Medicines:                Fentanyl 50 micrograms IV, Midazolam 5 mg IV Complications:            No immediate complications. Estimated Blood Loss:     Estimated blood loss: none. Procedure:                Pre-Anesthesia Assessment:                           - Prior to the procedure, a History and Physical                            was performed, and patient medications and                            allergies were reviewed. The patient's tolerance of                            previous anesthesia was also reviewed. The risks                            and benefits of the procedure and the sedation                            options and risks were discussed with the patient.                            All questions were answered, and informed consent                            was obtained. Prior Anticoagulants: The patient has                            taken no previous anticoagulant or antiplatelet                            agents. ASA Grade Assessment: II - A patient with                            mild systemic disease. After reviewing the risks  and benefits, the patient was deemed in                            satisfactory condition to undergo the procedure.                           - Sedation was administered by an endoscopy nurse.                            The sedation level attained was moderate.                           After obtaining informed consent, the colonoscope                            was passed under direct  vision. Throughout the                            procedure, the patient's blood pressure, pulse, and                            oxygen saturations were monitored continuously. The                            EC-3490LI ([id number]) scope was introduced through                            the anus and advanced to the the cecum, identified                            by appendiceal orifice and ileocecal valve. The                            colonoscopy was performed without difficulty. The                            patient tolerated the procedure well. The quality                            of the bowel preparation was good. The ileocecal                            valve, appendiceal orifice, and rectum were                            photographed. Findings:      The entire examined colon appeared normal. Old and fresh blood was noted       throughout the entire colon, but it lessened in the proximal colon. Impression:               - The entire examined colon is normal.                           - No specimens collected. Moderate Sedation:  None Recommendation:           - Return patient to hospital ward for ongoing care.                           - Clear liquid diet.                           - Continue present medications.                           - Repeat colonoscopy in 10 years for surveillance. Procedure Code(s):        --- Professional ---                           [phone], Colonoscopy, flexible; diagnostic, including                            collection of specimen(s) by brushing or washing,                            when performed (separate procedure) Diagnosis Code(s):        --- Professional ---                           K92.1, Melena (includes Hematochezia) CPT copyright 2016 American Medical Association. All rights reserved. The codes documented in this report are preliminary and upon coder review may  be revised to meet current compliance requirements. Jeani HawkingPatrick Dartanian Knaggs,  MD Jeani HawkingPatrick Marinna Blane, MD 11/29/2016 1:40:09 PM This report has been signed electronically. Number of Addenda: 0

## 2016-11-30 ENCOUNTER — Inpatient Hospital Stay (HOSPITAL_COMMUNITY): Payer: BLUE CROSS/BLUE SHIELD

## 2016-11-30 ENCOUNTER — Encounter (HOSPITAL_COMMUNITY)
Admission: AD | Disposition: A | Discharge status: Other Acute Inpatient Hospital | Payer: Self-pay | Source: Other Acute Inpatient Hospital | Attending: Internal Medicine

## 2016-11-30 ENCOUNTER — Encounter (HOSPITAL_COMMUNITY): Payer: Self-pay | Admitting: *Deleted

## 2016-11-30 DIAGNOSIS — K921 Melena: Secondary | ICD-10-CM

## 2016-11-30 DIAGNOSIS — D62 Acute posthemorrhagic anemia: Secondary | ICD-10-CM

## 2016-11-30 HISTORY — PX: ESOPHAGOGASTRODUODENOSCOPY: SHX5428

## 2016-11-30 LAB — CBC
HCT: 23.9 % — ABNORMAL LOW (ref 36.0–46.0)
HEMATOCRIT: 20.2 % — AB (ref 36.0–46.0)
Hemoglobin: 7.9 g/dL — ABNORMAL LOW (ref 12.0–15.0)
Hemoglobin: 6.7 g/dL — CL (ref 12.0–15.0)
MCH: 28 pg (ref 26.0–34.0)
MCH: 28.4 pg (ref 26.0–34.0)
MCHC: 33.1 g/dL (ref 30.0–36.0)
MCHC: 33.2 g/dL (ref 30.0–36.0)
MCV: 84.8 fL (ref 78.0–100.0)
MCV: 85.6 fL (ref 78.0–100.0)
PLATELETS: 106 10*3/uL — AB (ref 150–400)
Platelets: 102 10*3/uL — ABNORMAL LOW (ref 150–400)
RBC: 2.82 MIL/uL — AB (ref 3.87–5.11)
RBC: 2.36 MIL/uL — ABNORMAL LOW (ref 3.87–5.11)
RDW: 17.5 % — AB (ref 11.5–15.5)
RDW: 18 % — AB (ref 11.5–15.5)
WBC: 11.8 10*3/uL — ABNORMAL HIGH (ref 4.0–10.5)
WBC: 17.2 10*3/uL — AB (ref 4.0–10.5)

## 2016-11-30 LAB — FIBRINOGEN: Fibrinogen: 149 mg/dL — ABNORMAL LOW (ref 210–475)

## 2016-11-30 LAB — HEMOGLOBIN AND HEMATOCRIT, BLOOD
HCT: 23.6 % — ABNORMAL LOW (ref 36.0–46.0)
Hemoglobin: 8 g/dL — ABNORMAL LOW (ref 12.0–15.0)

## 2016-11-30 LAB — PREPARE RBC (CROSSMATCH)

## 2016-11-30 LAB — GLUCOSE, CAPILLARY
GLUCOSE-CAPILLARY: 102 mg/dL — AB (ref 65–99)
Glucose-Capillary: 99 mg/dL (ref 65–99)

## 2016-11-30 LAB — MRSA PCR SCREENING: MRSA BY PCR: NEGATIVE

## 2016-11-30 SURGERY — EGD (ESOPHAGOGASTRODUODENOSCOPY)
Anesthesia: Moderate Sedation

## 2016-11-30 MED ORDER — FENTANYL CITRATE (PF) 100 MCG/2ML IJ SOLN
INTRAMUSCULAR | Status: AC
  Filled 2016-11-30: qty 2

## 2016-11-30 MED ORDER — SODIUM CHLORIDE 0.9 % IV SOLN
80.0000 mg | Freq: Once | INTRAVENOUS | Status: AC
  Administered 2016-11-30: 80 mg via INTRAVENOUS
  Filled 2016-11-30 (×2): qty 80

## 2016-11-30 MED ORDER — MIDAZOLAM HCL 10 MG/2ML IJ SOLN
INTRAMUSCULAR | Status: DC | PRN
  Administered 2016-11-30 (×2): 1 mg via INTRAVENOUS

## 2016-11-30 MED ORDER — ACETAMINOPHEN 325 MG PO TABS: 650.0000 mg | ORAL_TABLET | ORAL | Status: DC | PRN

## 2016-11-30 MED ORDER — INSULIN ASPART 100 UNIT/ML ~~LOC~~ SOLN: 2.0000 [IU] | SUBCUTANEOUS | Status: DC

## 2016-11-30 MED ORDER — ONDANSETRON HCL 4 MG/2ML IJ SOLN: 4.0000 mg | Freq: Four times a day (QID) | INTRAMUSCULAR | Status: DC | PRN

## 2016-11-30 MED ORDER — SODIUM CHLORIDE 0.9 % IV SOLN: 250.0000 mL | INTRAVENOUS | Status: DC | PRN

## 2016-11-30 MED ORDER — FENTANYL CITRATE (PF) 100 MCG/2ML IJ SOLN: 100.0000 ug | INTRAMUSCULAR | Status: DC | PRN

## 2016-11-30 MED ORDER — SODIUM CHLORIDE 0.9 % IV SOLN
0.0000 ug/min | INTRAVENOUS | Status: DC
  Filled 2016-11-30: qty 1

## 2016-11-30 MED ORDER — DEXTROSE-NACL 5-0.45 % IV SOLN
INTRAVENOUS | Status: DC
  Administered 2016-11-30 – 2016-12-01 (×2): via INTRAVENOUS

## 2016-11-30 MED ORDER — SODIUM CHLORIDE 0.9 % IV SOLN
8.0000 mg/h | INTRAVENOUS | Status: DC
  Administered 2016-11-30 – 2016-12-01 (×2): 8 mg/h via INTRAVENOUS
  Filled 2016-11-30 (×4): qty 80

## 2016-11-30 MED ORDER — SODIUM CHLORIDE 0.9 % IV SOLN
Freq: Once | INTRAVENOUS | Status: AC
  Administered 2016-11-30: 12:00:00 via INTRAVENOUS

## 2016-11-30 MED ORDER — TECHNETIUM TC 99M-LABELED RED BLOOD CELLS IV KIT
26.8000 | PACK | Freq: Once | INTRAVENOUS | Status: AC | PRN
  Administered 2016-11-30: 26.8 via INTRAVENOUS

## 2016-11-30 MED ORDER — PANTOPRAZOLE SODIUM 40 MG IV SOLR: 40.0000 mg | Freq: Two times a day (BID) | INTRAVENOUS | Status: DC

## 2016-11-30 MED ORDER — FENTANYL CITRATE (PF) 100 MCG/2ML IJ SOLN
INTRAMUSCULAR | Status: DC | PRN
  Administered 2016-11-30: 25 ug via INTRAVENOUS

## 2016-11-30 MED ORDER — SODIUM CHLORIDE 0.9 % IV SOLN: INTRAVENOUS | Status: DC

## 2016-11-30 MED ORDER — MIDAZOLAM HCL 5 MG/ML IJ SOLN
INTRAMUSCULAR | Status: AC
  Filled 2016-11-30: qty 2

## 2016-11-30 NOTE — Progress Notes (Signed)
MD notified about patient having another large bloody BM. Will continue to monitor.

## 2016-11-30 NOTE — Progress Notes (Signed)
CRITICAL VALUE ALERT  Critical Value:  Hgb=6.7  Date & Time Notied:  06/1/72018  Provider Notified: Dr. Jomarie LongsJoseph  Orders Received/Actions taken: Dr. Jomarie LongsJoseph

## 2016-11-30 NOTE — Progress Notes (Addendum)
PROGRESS NOTE    Cathy Clark  ZOX:[id number] DOB: [birth year] DOA: 11/28/2016 PCP: Tommie Sams, DO  Brief Narrative: 56 year old female with history of hypertension, occasional headaches uses Goody powders 2 times a week admitted with multiple episodes of bright red blood per rectum occasionally mixed with darker stools for 3-4 days Hemoglobin 7.8 outside hospital transfused 1 unit of PRBC and transferred to [facility]   Assessment & Plan:   1. Upper GI bleeding -EGD yesterday with Non-bleeding gastric ulcers with a visible vessel. Treated with bipolar cautery. Clip (MR unsafe) was placed. -Colonoscopy yesterday unremarkable -Continue IV protonix -will update Dr.Hung about profuse bleeding this am, give more blood today -transfer to [facility]  2. Acute blood loss anemia -Secondary to above,  -s/p 5units PRBC so far -will need more blood today -Monitor CBC  3.  Essential hypertension -BP soft, holding ARB  4. Mild thombocytopenia -This is acute suspect related to consumption  DVT prophylaxis : SCDs   Code Status:  full code  Family Communication: daughter at bedside  Disposition Plan: Transfer to SDU  Consultants:   [facility]   Subjective: Had large amount of BRBPR this am  Objective: Vitals:   11/30/16 0443 11/30/16 0508 11/30/16 0733 11/30/16 1027  BP: 104/62 106/66 (!) 109/59 (!) 93/57  Pulse: 85 86  67  Resp: 18 18 18    Temp: 99.9 F (37.7 C) 100 F (37.8 C) 99.1 F (37.3 C)   TempSrc:  Oral Oral   SpO2:  97% 98% 97%  Weight:      Height:        Intake/Output Summary (Last 24 hours) at 11/30/16 1035 Last data filed at 11/30/16 8119  Gross per 24 hour  Intake          2280.67 ml  Output              200 ml  Net          2080.67 ml   Filed Weights   11/28/16 1600  Weight: 61.2 kg (135 lb)    Examination:  General exam: Thinly built female, sitting in bed, uncomfortable, ill appearing Cardiovascular system: S1 & S2 heard,  RRR. No JVD, murmur Gastrointestinal system: Abdomen is nondistended, soft and nontender. Normal bowel sounds heard. Central nervous system: Alert and oriented. No focal neurological deficits. Extremities: Symmetric 5 x 5 power. Skin: No rashes, lesions or ulcers Psychiatry: Judgement and insight appear normal. Mood & affect appropriate.     Data Reviewed:   CBC:  Recent Labs Lab 11/28/16 1644 11/28/16 2143 11/29/16 0642 11/29/16 2143 11/30/16 0753  WBC 11.7* 9.8 9.8 14.1* 11.8*  HGB 7.5* 6.8* 6.9* 6.3* 7.9*  HCT 22.7* 20.5* 21.1* 19.1* 23.9*  MCV 92.3 91.9 92.1 89.7 84.8  PLT 160 150 142* 113* 106*   Basic Metabolic Panel:  Recent Labs Lab 11/28/16 0936 11/29/16 0642 11/29/16 2143  NA 140 135 135  K 3.5 3.5 3.3*  CL 108 107 109  CO2 24 23 23   GLUCOSE 143* 133* 125*  BUN 18 11 8   CREATININE 0.84 0.86 0.75  CALCIUM 8.8* 7.9* 7.3*   GFR: Estimated Creatinine Clearance: 72.3 mL/min (by C-G formula based on SCr of 0.75 mg/dL). Liver Function Tests:  Recent Labs Lab 11/28/16 0936  AST 36  ALT 22  ALKPHOS 61  BILITOT 0.4  PROT 6.6  ALBUMIN 3.2*   No results for input(s): LIPASE, AMYLASE in the last 168 hours. No results for input(s): AMMONIA  in the last 168 hours. Coagulation Profile:  Recent Labs Lab 11/29/16 0642  INR 1.29   Cardiac Enzymes:  Recent Labs Lab 11/28/16 0936  TROPONINI <0.03   BNP (last 3 results) No results for input(s): PROBNP in the last 8760 hours. HbA1C: No results for input(s): HGBA1C in the last 72 hours. CBG: No results for input(s): GLUCAP in the last 168 hours. Lipid Profile: No results for input(s): CHOL, HDL, LDLCALC, TRIG, CHOLHDL, LDLDIRECT in the last 72 hours. Thyroid Function Tests: No results for input(s): TSH, T4TOTAL, FREET4, T3FREE, THYROIDAB in the last 72 hours. Anemia Panel: No results for input(s): VITAMINB12, FOLATE, FERRITIN, TIBC, IRON, RETICCTPCT in the last 72 hours. Urine analysis: No results  found for: COLORURINE, APPEARANCEUR, LABSPEC, PHURINE, GLUCOSEU, HGBUR, BILIRUBINUR, KETONESUR, PROTEINUR, UROBILINOGEN, NITRITE, LEUKOCYTESUR Sepsis Labs: @LABRCNTIP (procalcitonin:4,lacticidven:4)  )No results found for this or any previous visit (from the past 240 hour(s)).       Radiology Studies: No results found.      Scheduled Meds: . pantoprazole (PROTONIX) IV  40 mg Intravenous Q12H   Continuous Infusions: . sodium chloride 900 mL (11/30/16 0944)     LOS: 2 days    Time spent: 35min    Zannie CovePreetha Verdon Ferrante, MD [facility] Pager [phone]  If 7PM-7AM, please contact night-coverage [email or web address] Password [email or web address] 11/30/2016, 10:35 AM

## 2016-11-30 NOTE — Progress Notes (Signed)
Subjective: No complaints.  Some residual blood from the prep yesterday in bowel movements.  Objective: Vital signs in last 24 hours: Temp:  [97.9 F (36.6 C)-100 F (37.8 C)] 99.1 F (37.3 C) (06/17 0733) Pulse Rate:  [66-93] 86 (06/17 0508) Resp:  [14-20] 18 (06/17 0733) BP: (77-121)/(44-70) 109/59 (06/17 0733) SpO2:  [77 %-100 %] 98 % (06/17 0733) Last BM Date: 11/29/16  Intake/Output from previous day: 06/16 0701 - 06/17 0700 In: 1960.7 [I.V.:1271.7; Blood:689] Out: 200 [Urine:200] Intake/Output this shift: Total I/O In: 320 [Blood:320] Out: -   General appearance: alert and no distress GI: soft, non-tender; bowel sounds normal; no masses,  no organomegaly  Lab Results:  Recent Labs  11/28/16 2143 11/29/16 0642 11/29/16 2143  WBC 9.8 9.8 14.1*  HGB 6.8* 6.9* 6.3*  HCT 20.5* 21.1* 19.1*  PLT 150 142* 113*   BMET  Recent Labs  11/28/16 0936 11/29/16 0642 11/29/16 2143  NA 140 135 135  K 3.5 3.5 3.3*  CL 108 107 109  CO2 24 23 23   GLUCOSE 143* 133* 125*  BUN 18 11 8   CREATININE 0.84 0.86 0.75  CALCIUM 8.8* 7.9* 7.3*   LFT  Recent Labs  11/28/16 0936  PROT 6.6  ALBUMIN 3.2*  AST 36  ALT 22  ALKPHOS 61  BILITOT 0.4   PT/INR  Recent Labs  11/29/16 0642  LABPROT 16.1*  INR 1.29   Hepatitis Panel No results for input(s): HEPBSAG, HCVAB, HEPAIGM, HEPBIGM in the last 72 hours. C-Diff No results for input(s): CDIFFTOX in the last 72 hours. Fecal Lactopherrin No results for input(s): FECLLACTOFRN in the last 72 hours.  Studies/Results: No results found.  Medications:  Scheduled: . pantoprazole (PROTONIX) IV  40 mg Intravenous Q12H   Continuous: . sodium chloride 100 mL/hr at 11/30/16 0003    Assessment/Plan: 1) Rapid upper GI bleed secondary to NSAID ulcerations. 2) Anemia.    HGB is pending this AM.  She received additional blood transfusions as her HGB was still low with the initial transfusions.    Plan: 1) Follow HGB and  transfuse as necessary. 2) PPI BID x 1 month and then QD for another month.   3) Avoid NSAIDs.  If NSAIDs are required, she needs to take them with a PPI. 4) Follow up gastric biopsies for H. Pylori. 5) [facility] to resume care in the AM.    LOS: 2 days   Marinna Blane D 11/30/2016, 8:06 AM

## 2016-11-30 NOTE — Consult Note (Signed)
PULMONARY / CRITICAL CARE MEDICINE   Name: Cathy Clark MRN: [id number] DOB: 1961/03/03    ADMISSION DATE:  11/28/2016 CONSULTATION DATE:  11/30/2016  REFERRING MD:  Dr. Jomarie Longs  CHIEF COMPLAINT:  GI bleed  HISTORY OF PRESENT ILLNESS:    This is a 56 year old female with history of hypertension and headaches who presented with complaint of dark brown stools per rectum. No heartburn. She was initially on the floors and underwent EGD and colonoscopy yesterday. Colonoscopy was normal. EGD showed 2 nonbleeding gastric ulcers which were cauterized and clipped.  However patient continues to have a blood per rectum and hemoglobin dropped into the 6's. She has nausea but no vomiting or abdominal pain. No urinary bowel complaints. No chest pain. She has some dizziness when she gets up. No other complaints.  She has already received 7 units of PRBC total since admission. Her platelets and INR are within normal limits.  Apparently she was taking some Goody powder 3-4 times a week for headache  No history of illicit drug abuse or alcohol use or tobacco use. Mother had breast cancer  She had initially presented to [facility] with painless rectal bleeding. She was given a unit of blood and then transferred to [facility] for further workup.  PAST MEDICAL HISTORY :  She  has a past medical history of Anemia (11/28/2016); Complication of anesthesia (1997); History of blood transfusion (11/28/2016); and Hypertension.  PAST SURGICAL HISTORY: She  has a past surgical history that includes Breast biopsy (1990s); Breast cyst aspiration (1990s); Esophagogastroduodenoscopy (N/A, 11/29/2016); and Colonoscopy (N/A, 11/29/2016).  Allergies  Allergen Reactions  . Shrimp [Shellfish Allergy] Other (See Comments)    unspecified  . Cefadroxil Rash  . Lisinopril Rash    No current facility-administered medications on file prior to encounter.    Current Outpatient Prescriptions on File Prior to Encounter   Medication Sig  . EPINEPHrine 0.3 mg/0.3 mL IJ SOAJ injection Inject 0.3 mLs (0.3 mg total) into the muscle once.  Marland Kitchen losartan (COZAAR) 50 MG tablet Take 1 tablet (50 mg total) by mouth daily.    FAMILY HISTORY:  Her indicated that her mother is deceased.    SOCIAL HISTORY: She  reports that she has never smoked. She has never used smokeless tobacco. She reports that she does not drink alcohol or use drugs.  REVIEW OF SYSTEMS:   14 point review of systems done and is negative except for mentioned in history of present illness  SUBJECTIVE:  Physical Exam  Constitutional: She is oriented to person, place, and time. She appears well-developed and well-nourished. No distress.  HENT:  Head: Normocephalic and atraumatic.  Eyes: EOM are normal. Pupils are equal, round, and reactive to light. Right eye exhibits no discharge.  Neck: Normal range of motion. Neck supple.  Cardiovascular: Normal rate, regular rhythm, normal heart sounds and intact distal pulses.  Exam reveals no gallop.   No murmur heard. Pulmonary/Chest: Effort normal and breath sounds normal. No respiratory distress. She has no wheezes. She has no rales. She exhibits no tenderness.  Abdominal: Soft. Bowel sounds are normal. She exhibits no distension. There is no tenderness. There is no rebound and no guarding.  Musculoskeletal: Normal range of motion. She exhibits no edema, tenderness or deformity.  Neurological: She is alert and oriented to person, place, and time. She displays normal reflexes. She exhibits normal muscle tone.  Skin: Skin is warm. She is not diaphoretic.  Psychiatric: Her behavior is normal.  Nursing note and vitals reviewed.  VITAL SIGNS: BP (!) 90/48   Pulse 81   Temp 98.4 F (36.9 C) (Oral)   Resp 19   Ht 5\' 5"  (1.651 m)   Wt 61.2 kg (135 lb)   LMP 12/13/2014   SpO2 100%   BMI 22.47 kg/m    INTAKE / OUTPUT: I/O last 3 completed shifts: In: 3531.7 [I.V.:2521.7; Blood:1010] Out: 200  [Urine:200]  LABS:  BMET  Recent Labs Lab 11/28/16 0936 11/29/16 0642 11/29/16 2143  NA 140 135 135  K 3.5 3.5 3.3*  CL 108 107 109  CO2 24 23 23   BUN 18 11 8   CREATININE 0.84 0.86 0.75  GLUCOSE 143* 133* 125*    Electrolytes  Recent Labs Lab 11/28/16 0936 11/29/16 0642 11/29/16 2143  CALCIUM 8.8* 7.9* 7.3*    CBC  Recent Labs Lab 11/29/16 2143 11/30/16 0753 11/30/16 1031  WBC 14.1* 11.8* 17.2*  HGB 6.3* 7.9* 6.7*  HCT 19.1* 23.9* 20.2*  PLT 113* 106* 102*    Coag's  Recent Labs Lab 11/29/16 0642  INR 1.29    Sepsis Markers No results for input(s): LATICACIDVEN, PROCALCITON, O2SATVEN in the last 168 hours.  ABG No results for input(s): PHART, PCO2ART, PO2ART in the last 168 hours.  Liver Enzymes  Recent Labs Lab 11/28/16 0936  AST 36  ALT 22  ALKPHOS 61  BILITOT 0.4  ALBUMIN 3.2*    Cardiac Enzymes  Recent Labs Lab 11/28/16 0936  TROPONINI <0.03    Glucose  Recent Labs Lab 11/30/16 1432  GLUCAP 99    Imaging No results found.   ASSESSMENT / PLAN:  PULMONARY Stable on room air   CARDIOVASCULAR A:  Borderline hypotension P:  Normal saline bolus followed by continuous infusion. Use pressors as needed.  RENAL A:   Stable  GASTROINTESTINAL A:   Upper GI bleed-status post EGD 2. Gastroenterology recommends nuclear bleeding scan. P:   Nuclear bleeding scan ordered. Many general surgery consult if source not found  HEMATOLOGIC A:   Anemia from blood loss  P:  Transfuse PRBC to keep hemoglobin greater than 7. H&H every 6  INFECTIOUS no signs of infection   ENDOCRINE ICU Glycemic insulin scale    FAMILY  - No family currently at bedside however the hospitalist has updated the family prior to transfer to ICU   STAFF NOTE: I, Wilber OliphantAmar Lucious Zou, MD have personally reviewed patient's available data, including medical history, events of note, physical examination and test results as part of my  evaluation.  The patient is critically ill with multiple organ systems failure and requires high complexity decision making for assessment and support, frequent evaluation and titration of therapies, application of advanced monitoring technologies and extensive interpretation of multiple databases.   Critical Care Time devoted to patient care services described in this note is 32  Minutes. This time reflects time of care of this signee: Wilber OliphantAmar Navarre Diana, MD.  Pulmonary and Critical Care Medicine [facility] Pager: [phone]  11/30/2016, 3:04 PM

## 2016-11-30 NOTE — Progress Notes (Addendum)
Pt with upper GI bleed, EGD yesterday with gastric ulcers, s/p cautery and clipping of vessel -Still with ongoing bleeding, 2 large episodes of dark blood this am with soft BPs in 90s range D/w Dr.Hung again, will Transfer to ICU, repeat EGD today, will make NPO Getting 2 more Units of blood now Also consulted PCCM, high risk of deterioration  -[facility]-I have told them she is too unstable to transfer anywhere now  Zannie CovePreetha Genaro Bekker, MD

## 2016-11-30 NOTE — Procedures (Signed)
EGD Report.  This is the official report as the ProVation system was not functional at the time of the EGD.  Indication: Gastric ulcer with recurrent hematochezia Medications: Fentanyl 25 mcg IV and Versed 1 mg IV  Findings:  The esophagus was normal.  The gastric lumen was negative for any evidence of fresh or old blood.  A copious amount of bile was found.  In the antrum the two treated ulcers were identified, but there was no overt site of bleeding from these areas.  The duodenum was also normal.  Because of the severity of the recurrent hematochezia, I opted to close up the ulcers with additional hemoclips.  This will eliminate the ulcers as a potential site for recurrent bleeding.    Plan: 1) Bleeding scan. 2) Supportive care in ICU. 3) Transfuse as needed. 4) Follow HGB.

## 2016-11-30 NOTE — Progress Notes (Signed)
Patient is transferred to 2M07 by MD order. Family at bedside is aware about the transfer. 1st unit of blood was started at 12:20 o'clock. Report was given to the nurse who is going to receive the patient.

## 2016-11-30 NOTE — Progress Notes (Signed)
Pt has CBC scheduled every 8 hours, last was due at 0400, pt has blood transfusing at that time. Lab called and RN spoke with [employer] Lab Tech, instruction given not to draw labs before 1000, 2 hours post transfusion.

## 2016-11-30 NOTE — Progress Notes (Signed)
I just spoke with Dr. Jomarie LongsJoseph.  She alerted me to the fact that the patient had a couple of large bloody bowel movements and her blood pressure has mildly declined.  With her recent finding of the gastric ulcer I will repeat the EGD.  I requested that the patient be transferred to ICU.

## 2016-12-01 ENCOUNTER — Encounter (HOSPITAL_COMMUNITY): Payer: Self-pay | Admitting: Gastroenterology

## 2016-12-01 DIAGNOSIS — K259 Gastric ulcer, unspecified as acute or chronic, without hemorrhage or perforation: Secondary | ICD-10-CM

## 2016-12-01 DIAGNOSIS — K922 Gastrointestinal hemorrhage, unspecified: Secondary | ICD-10-CM

## 2016-12-01 LAB — GLUCOSE, CAPILLARY
GLUCOSE-CAPILLARY: 96 mg/dL (ref 65–99)
Glucose-Capillary: 93 mg/dL (ref 65–99)
Glucose-Capillary: 98 mg/dL (ref 65–99)

## 2016-12-01 LAB — BASIC METABOLIC PANEL
ANION GAP: 3 — AB (ref 5–15)
BUN: 6 mg/dL (ref 6–20)
CO2: 23 mmol/L (ref 22–32)
Calcium: 7.3 mg/dL — ABNORMAL LOW (ref 8.9–10.3)
Chloride: 113 mmol/L — ABNORMAL HIGH (ref 101–111)
Creatinine, Ser: 0.75 mg/dL (ref 0.44–1.00)
GLUCOSE: 104 mg/dL — AB (ref 65–99)
POTASSIUM: 3.2 mmol/L — AB (ref 3.5–5.1)
Sodium: 139 mmol/L (ref 135–145)

## 2016-12-01 LAB — TYPE AND SCREEN
ABO/RH(D): O POS
Antibody Screen: NEGATIVE
UNIT DIVISION: 0
UNIT DIVISION: 0
UNIT DIVISION: 0
UNIT DIVISION: 0
Unit division: 0
Unit division: 0
Unit division: 0

## 2016-12-01 LAB — CBC
HEMATOCRIT: 22.6 % — AB (ref 36.0–46.0)
HEMATOCRIT: 23 % — AB (ref 36.0–46.0)
HEMOGLOBIN: 7.8 g/dL — AB (ref 12.0–15.0)
Hemoglobin: 7.8 g/dL — ABNORMAL LOW (ref 12.0–15.0)
MCH: 29.7 pg (ref 26.0–34.0)
MCH: 29.2 pg (ref 26.0–34.0)
MCHC: 34.5 g/dL (ref 30.0–36.0)
MCHC: 33.9 g/dL (ref 30.0–36.0)
MCV: 85.9 fL (ref 78.0–100.0)
MCV: 86.1 fL (ref 78.0–100.0)
PLATELETS: 92 10*3/uL — AB (ref 150–400)
Platelets: 115 10*3/uL — ABNORMAL LOW (ref 150–400)
RBC: 2.63 MIL/uL — AB (ref 3.87–5.11)
RBC: 2.67 MIL/uL — AB (ref 3.87–5.11)
RDW: 16.4 % — ABNORMAL HIGH (ref 11.5–15.5)
RDW: 16.5 % — ABNORMAL HIGH (ref 11.5–15.5)
WBC: 11.4 10*3/uL — AB (ref 4.0–10.5)
WBC: 12.2 10*3/uL — AB (ref 4.0–10.5)

## 2016-12-01 LAB — BPAM RBC
BLOOD PRODUCT EXPIRATION DATE: 201807042359
BLOOD PRODUCT EXPIRATION DATE: 201807112359
BLOOD PRODUCT EXPIRATION DATE: 201807112359
BLOOD PRODUCT EXPIRATION DATE: 201807122359
Blood Product Expiration Date: 201807112359
Blood Product Expiration Date: 201807122359
Blood Product Expiration Date: 201807122359
ISSUE DATE / TIME: 201806160050
ISSUE DATE / TIME: 201806161102
ISSUE DATE / TIME: 201806161444
ISSUE DATE / TIME: 201806170123
ISSUE DATE / TIME: 201806170441
ISSUE DATE / TIME: 201806171210
ISSUE DATE / TIME: 201806171423
UNIT TYPE AND RH: 5100
UNIT TYPE AND RH: 5100
Unit Type and Rh: 5100
Unit Type and Rh: 5100
Unit Type and Rh: 5100
Unit Type and Rh: 5100
Unit Type and Rh: 5100

## 2016-12-01 LAB — HIV ANTIBODY (ROUTINE TESTING W REFLEX): HIV SCREEN 4TH GENERATION: NONREACTIVE

## 2016-12-01 LAB — HEMOGLOBIN AND HEMATOCRIT, BLOOD
HEMATOCRIT: 22.4 % — AB (ref 36.0–46.0)
HEMOGLOBIN: 7.6 g/dL — AB (ref 12.0–15.0)

## 2016-12-01 LAB — PHOSPHORUS: PHOSPHORUS: 2.6 mg/dL (ref 2.5–4.6)

## 2016-12-01 LAB — PROTIME-INR
INR: 1.21
Prothrombin Time: 15.4 seconds — ABNORMAL HIGH (ref 11.4–15.2)

## 2016-12-01 LAB — MAGNESIUM: Magnesium: 1.5 mg/dL — ABNORMAL LOW (ref 1.7–2.4)

## 2016-12-01 MED ORDER — POTASSIUM CHLORIDE 20 MEQ/15ML (10%) PO SOLN
30.0000 meq | Freq: Two times a day (BID) | ORAL | Status: AC
  Administered 2016-12-01 – 2016-12-02 (×2): 30 meq via ORAL
  Filled 2016-12-01 (×2): qty 30

## 2016-12-01 MED ORDER — POTASSIUM CHLORIDE CRYS ER 20 MEQ PO TBCR: 30.0000 meq | EXTENDED_RELEASE_TABLET | Freq: Two times a day (BID) | ORAL | Status: DC

## 2016-12-01 MED ORDER — PANTOPRAZOLE SODIUM 40 MG IV SOLR
40.0000 mg | Freq: Two times a day (BID) | INTRAVENOUS | Status: DC
  Administered 2016-12-01 – 2016-12-02 (×3): 40 mg via INTRAVENOUS
  Filled 2016-12-01 (×3): qty 40

## 2016-12-01 MED ORDER — MAGNESIUM SULFATE 2 GM/50ML IV SOLN
2.0000 g | Freq: Once | INTRAVENOUS | Status: AC
  Administered 2016-12-01: 2 g via INTRAVENOUS
  Filled 2016-12-01: qty 50

## 2016-12-01 MED ORDER — POTASSIUM CHLORIDE CRYS ER 20 MEQ PO TBCR
30.0000 meq | EXTENDED_RELEASE_TABLET | Freq: Two times a day (BID) | ORAL | Status: DC
  Administered 2016-12-01: 30 meq via ORAL
  Filled 2016-12-01: qty 1

## 2016-12-01 NOTE — Progress Notes (Signed)
PROGRESS NOTE    Cathy Clark  WUJ:[id number] DOB: 07/30/1960 DOA: 11/28/2016 PCP: Tommie Samsook, Jayce G, DO  Brief Narrative: 56 year old female with history of hypertension, occasional headaches uses Goody powders 2 times a week admitted with multiple episodes of bright red blood per rectum occasionally mixed with darker stools for 3-4 days Hemoglobin 7.8 outside hospital transfused 1 unit of PRBC and [facility]   Assessment & Plan:   1. Upper GI bleeding -EGD 6/16 with Non-bleeding gastric ulcers with a visible vessel. Treated with bipolar cautery. Clip (MR unsafe) was placed. -Colonoscopy 6/16 was unremarkable -then had 2 episodes of profuse bleeding again yesterday am with hypotension, transferred to ICU, urgent EGD done by Dr.Hung, no active bleeding noted, additional hemoclips placed at the ulcer site -given 8units PRBC total between OSH and Here -bleeding appears to be subsiding, no more episodes overnight -start diet, change PPI to Q12  2. Acute blood loss anemia -Secondary to above,  -s/p8 units PRBC this admission -monitor CBC  3.  Essential hypertension -BP soft, holding ARB  4. Mild thombocytopenia -This is acute suspect related to consumption  DVT prophylaxis : SCDs   Code Status:  full code  Family Communication: daughter at bedside  Disposition Plan: Transfer to SDU  Consultants:   [facility]   Subjective: No more bleeding last pm or this am  Objective: Vitals:   12/01/16 0700 12/01/16 0740 12/01/16 0800 12/01/16 0900  BP: 101/73  102/76 108/80  Pulse: 70  76 73  Resp: 19  (!) 21 17  Temp:  97.8 F (36.6 C)    TempSrc:  Oral    SpO2: 100%  100% 100%  Weight:      Height:        Intake/Output Summary (Last 24 hours) at 12/01/16 1103 Last data filed at 12/01/16 0900  Gross per 24 hour  Intake          2894.58 ml  Output              375 ml  Net          2519.58 ml   Filed Weights   11/28/16 1600 12/01/16 0419  Weight:  61.2 kg (135 lb) 66.7 kg (147 lb 0.8 oz)    Examination:  Gen: Awake, Alert, Oriented X 3, tinly built female, no distress HEENT: PERRLA, Neck supple, no JVD Lungs: Good air movement bilaterally, CTAB CVS: RRR,No Gallops,Rubs or new Murmurs Abd: soft, Non tender, non distended, BS present Extremities: No Cyanosis, Clubbing or edema Skin: no new rashes    Data Reviewed:   CBC:  Recent Labs Lab 11/29/16 0642 11/29/16 2143 11/30/16 0753 11/30/16 1031 11/30/16 2244 12/01/16 0429 12/01/16 0824  WBC 9.8 14.1* 11.8* 17.2*  --   --  11.4*  HGB 6.9* 6.3* 7.9* 6.7* 8.0* 7.6* 7.8*  HCT 21.1* 19.1* 23.9* 20.2* 23.6* 22.4* 22.6*  MCV 92.1 89.7 84.8 85.6  --   --  85.9  PLT 142* 113* 106* 102*  --   --  92*   Basic Metabolic Panel:  Recent Labs Lab 11/28/16 0936 11/29/16 0642 11/29/16 2143 12/01/16 0824  NA 140 135 135 139  K 3.5 3.5 3.3* 3.2*  CL 108 107 109 113*  CO2 24 23 23 23   GLUCOSE 143* 133* 125* 104*  BUN 18 11 8 6   CREATININE 0.84 0.86 0.75 0.75  CALCIUM 8.8* 7.9* 7.3* 7.3*  MG  --   --   --  1.5*  PHOS  --   --   --  2.6   GFR: Estimated Creatinine Clearance: 72.3 mL/min (by C-G formula based on SCr of 0.75 mg/dL). Liver Function Tests:  Recent Labs Lab 11/28/16 0936  AST 36  ALT 22  ALKPHOS 61  BILITOT 0.4  PROT 6.6  ALBUMIN 3.2*   No results for input(s): LIPASE, AMYLASE in the last 168 hours. No results for input(s): AMMONIA in the last 168 hours. Coagulation Profile:  Recent Labs Lab 11/29/16 0642 12/01/16 0824  INR 1.29 1.21   Cardiac Enzymes:  Recent Labs Lab 11/28/16 0936  TROPONINI <0.03   BNP (last 3 results) No results for input(s): PROBNP in the last 8760 hours. HbA1C: No results for input(s): HGBA1C in the last 72 hours. CBG:  Recent Labs Lab 11/30/16 1432 11/30/16 2209 12/01/16 0003 12/01/16 0414 12/01/16 0738  GLUCAP 99 102* 98 96 93   Lipid Profile: No results for input(s): CHOL, HDL, LDLCALC, TRIG,  CHOLHDL, LDLDIRECT in the last 72 hours. Thyroid Function Tests: No results for input(s): TSH, T4TOTAL, FREET4, T3FREE, THYROIDAB in the last 72 hours. Anemia Panel: No results for input(s): VITAMINB12, FOLATE, FERRITIN, TIBC, IRON, RETICCTPCT in the last 72 hours. Urine analysis: No results found for: COLORURINE, APPEARANCEUR, LABSPEC, PHURINE, GLUCOSEU, HGBUR, BILIRUBINUR, KETONESUR, PROTEINUR, UROBILINOGEN, NITRITE, LEUKOCYTESUR Sepsis Labs: @LABRCNTIP (procalcitonin:4,lacticidven:4)  ) Recent Results (from the past 240 hour(s))  MRSA PCR Screening     Status: None   Collection Time: 11/30/16  2:54 PM  Result Value Ref Range Status   MRSA by PCR NEGATIVE NEGATIVE Final    Comment:        The GeneXpert MRSA Assay (FDA approved for NASAL specimens only), is one component of a comprehensive MRSA colonization surveillance program. It is not intended to diagnose MRSA infection nor to guide or monitor treatment for MRSA infections.          Radiology Studies: Nm Gi Blood Loss  Result Date: 11/30/2016 CLINICAL DATA:  56 year old female with GI bleed. EXAM: NUCLEAR MEDICINE GASTROINTESTINAL BLEEDING SCAN TECHNIQUE: Sequential abdominal images were obtained following intravenous administration of Tc-37m labeled red blood cells. RADIOPHARMACEUTICALS:  26.8 mCi Tc-55m in-vitro labeled red cells. COMPARISON:  None. FINDINGS: There is physiologic uptake in the liver, cardiac blood pool, aorta, IVC, and iliac vessels. Excreted radiopharmaceutical noted within the bladder. No abnormal uptake noted conforming to the bowel to suggest active GI bleed. IMPRESSION: No definite scintigraphic evidence of active GI bleed. Electronically Signed   By: Elgie Collard M.D.   On: 11/30/2016 23:56        Scheduled Meds: . pantoprazole  40 mg Intravenous Q12H   Continuous Infusions: . sodium chloride 700 mL (11/30/16 1214)  . sodium chloride       LOS: 3 days    Time spent:     Zannie Cove, MD [facility] Pager [phone]  If 7PM-7AM, please contact night-coverage [email or web address] Password [email or web address] 12/01/2016, 11:03 AM

## 2016-12-01 NOTE — Progress Notes (Signed)
Daily Rounding Note  12/01/2016, 11:03 AM  LOS: 3 days   SUBJECTIVE:   Chief complaint:     No BMs since the EGD yesterday.  No pain, no N/V   OBJECTIVE:         Vital signs in last 24 hours:    Temp:  [97.8 F (36.6 C)-98.6 F (37 C)] 97.8 F (36.6 C) (06/18 0740) Pulse Rate:  [59-113] 73 (06/18 0900) Resp:  [2-30] 17 (06/18 0900) BP: (69-111)/(41-80) 108/80 (06/18 0900) SpO2:  [87 %-100 %] 100 % (06/18 0900) Weight:  [66.7 kg (147 lb 0.8 oz)] 66.7 kg (147 lb 0.8 oz) (06/18 0419) Last BM Date: 11/30/16 Filed Weights   11/28/16 1600 12/01/16 0419  Weight: 61.2 kg (135 lb) 66.7 kg (147 lb 0.8 oz)   General: pleasant, comfortable.  Looks well   Heart: RRR Chest: clear bil.   Abdomen: soft, NT, ND.  Active BS  Extremities: no CCE.   Neuro/Psych:  Pleasant, cooperative.  No deficits.    Intake/Output from previous day: 06/17 0701 - 06/18 0700 In: 3014.6 [I.V.:2036.3; Blood:978.3] Out: 75 [Urine:75]  Intake/Output this shift: Total I/O In: 200 [I.V.:200] Out: 300 [Urine:300]  Lab Results:  Recent Labs  11/30/16 0753 11/30/16 1031 11/30/16 2244 12/01/16 0429 12/01/16 0824  WBC 11.8* 17.2*  --   --  11.4*  HGB 7.9* 6.7* 8.0* 7.6* 7.8*  HCT 23.9* 20.2* 23.6* 22.4* 22.6*  PLT 106* 102*  --   --  92*   BMET  Recent Labs  11/29/16 0642 11/29/16 2143 12/01/16 0824  NA 135 135 139  K 3.5 3.3* 3.2*  CL 107 109 113*  CO2 23 23 23   GLUCOSE 133* 125* 104*  BUN 11 8 6   CREATININE 0.86 0.75 0.75  CALCIUM 7.9* 7.3* 7.3*   LFT No results for input(s): PROT, ALBUMIN, AST, ALT, ALKPHOS, BILITOT, BILIDIR, IBILI in the last 72 hours. PT/INR  Recent Labs  11/29/16 0642 12/01/16 0824  LABPROT 16.1* 15.4*  INR 1.29 1.21    Studies/Results: Nm Gi Blood Loss  Result Date: 11/30/2016 CLINICAL DATA:  56 year old female with GI bleed. EXAM: NUCLEAR MEDICINE GASTROINTESTINAL BLEEDING SCAN TECHNIQUE:  Sequential abdominal images were obtained following intravenous administration of Tc-8745m labeled red blood cells. RADIOPHARMACEUTICALS:  26.8 mCi Tc-4645m in-vitro labeled red cells. COMPARISON:  None. FINDINGS: There is physiologic uptake in the liver, cardiac blood pool, aorta, IVC, and iliac vessels. Excreted radiopharmaceutical noted within the bladder. No abnormal uptake noted conforming to the bowel to suggest active GI bleed. IMPRESSION: No definite scintigraphic evidence of active GI bleed. Electronically Signed   By: Elgie CollardArash  Radparvar M.D.   On: 11/30/2016 23:56   Scheduled Meds: . pantoprazole  40 mg Intravenous Q12H   Continuous Infusions: . sodium chloride 700 mL (11/30/16 1214)  . sodium chloride     PRN Meds:.sodium chloride, acetaminophen, albuterol, ondansetron (ZOFRAN) IV, ondansetron **OR** [DISCONTINUED] ondansetron (ZOFRAN) IV   ASSESMENT:   *  GI bleed.  Hematochezia.   11/29/16 EGD.  Non-bleeding antral, lesser curvature ulcers with VV.  Bicapped and clipped x 1.  Biopsy obtained.  11/29/16 Colonoscopy: old and fresh blood present, less intense in proximal colon.  No masses, ulcers or tics seen.  Re-bled and retransfused on 6/17.   11/30/16 EGD #2.  No blood seen.  Two ulcers noted, not bleeding.  Additonal endoclips placed at ulcers due to severity of recurrent bleeding.    Protonix IV  BID started 6/15, switched to gtt 6/17 at 1500  *  Blood loss anemia.  Hgb stable after PRBC x 7.  *  Thrombocytopenia.  *  hypokalemia       PLAN   *  Await pathology.  Agree with clear diet and advance as tolerated.  CBC this afternoon and in AM.  Agree with switch to IV, BID Protonix this AM.     Jennye Moccasin  12/01/2016, 11:03 AM Pager: [phone]     Attending physician's note   I have taken an interval history, reviewed the chart and examined the patient. I agree with the Advanced Practitioner's note, impression and recommendations. Two antral ulcers with no recurrent  bleeding overnight and today. Bleeding scan was negative. Continue IV PPI bid. Trend CBC. No ASA/NSAIDs. Clears and advance diet slowly if no recurrent bleeding.  Claudette Head, MD [facility] [phone] Mon-Fri 8a-5p [phone] after 5p, weekends, holidays

## 2016-12-01 NOTE — Progress Notes (Signed)
Report called to Sarah on [facility]. Pt to be transferred via bed, nurse, and NT. VS WNL.

## 2016-12-01 NOTE — Consult Note (Signed)
PULMONARY / CRITICAL CARE MEDICINE   Name: Cathy Clark MRN: [id number] DOB: Apr 15, 1961    ADMISSION DATE:  11/28/2016 CONSULTATION DATE:  11/30/2016  REFERRING MD:  Dr. Jomarie Longs  CHIEF COMPLAINT:  GI bleed  HISTORY OF PRESENT ILLNESS:    This is a 56 year old female with history of hypertension and headaches who presented with complaint of dark brown stools per rectum. No heartburn. She was initially on the floors and underwent EGD and colonoscopy yesterday. Colonoscopy was normal. EGD showed 2 nonbleeding gastric ulcers which were cauterized and clipped.  However patient continues to have a blood per rectum and hemoglobin dropped into the 6's. She has nausea but no vomiting or abdominal pain. No urinary bowel complaints. No chest pain. She has some dizziness when she gets up. No other complaints.  She has already received 7 units of PRBC total since admission. Her platelets and INR are within normal limits.  Apparently she was taking some Goody powder 3-4 times a week for headache  No history of illicit drug abuse or alcohol use or tobacco use. Mother had breast cancer  She had initially presented to [facility] with painless rectal bleeding. She was given a unit of blood and then transferred to [facility] for further workup.  SUBJECTIVE:  No events overnight  Physical Exam  Constitutional: She is oriented to person, place, and time. She appears well-developed and well-nourished. No distress.  HENT:  Head: Normocephalic and atraumatic.  Eyes: EOM are normal. Pupils are equal, round, and reactive to light. Right eye exhibits no discharge.  Neck: Normal range of motion. Neck supple.  Cardiovascular: Normal rate, regular rhythm, normal heart sounds and intact distal pulses.  Exam reveals no gallop.   No murmur heard. Pulmonary/Chest: Effort normal and breath sounds normal. No respiratory distress. She has no wheezes. She has no rales. She exhibits no tenderness.  Abdominal: Soft.  Bowel sounds are normal. She exhibits no distension. There is no tenderness. There is no rebound and no guarding.  Musculoskeletal: Normal range of motion. She exhibits no edema, tenderness or deformity.  Neurological: She is alert and oriented to person, place, and time. She displays normal reflexes. She exhibits normal muscle tone.  Skin: Skin is warm. She is not diaphoretic.  Psychiatric: Her behavior is normal.  Nursing note and vitals reviewed.    VITAL SIGNS: BP 106/71 (BP Location: Right Arm)   Pulse 71   Temp 98.2 F (36.8 C) (Oral)   Resp 14   Ht 5\' 5"  (1.651 m)   Wt 147 lb 0.8 oz (66.7 kg)   LMP 12/13/2014   SpO2 100%   BMI 24.47 kg/m    INTAKE / OUTPUT: I/O last 3 completed shifts: In: 4049.6 [I.V.:2736.3; Blood:1313.3] Out: 275 [Urine:275]  LABS:  BMET  Recent Labs Lab 11/29/16 0642 11/29/16 2143 12/01/16 0824  NA 135 135 139  K 3.5 3.3* 3.2*  CL 107 109 113*  CO2 23 23 23   BUN 11 8 6   CREATININE 0.86 0.75 0.75  GLUCOSE 133* 125* 104*    Electrolytes  Recent Labs Lab 11/29/16 0642 11/29/16 2143 12/01/16 0824  CALCIUM 7.9* 7.3* 7.3*  MG  --   --  1.5*  PHOS  --   --  2.6    CBC  Recent Labs Lab 11/30/16 0753 11/30/16 1031 11/30/16 2244 12/01/16 0429 12/01/16 0824  WBC 11.8* 17.2*  --   --  11.4*  HGB 7.9* 6.7* 8.0* 7.6* 7.8*  HCT 23.9* 20.2* 23.6* 22.4*  22.6*  PLT 106* 102*  --   --  92*    Coag's  Recent Labs Lab 11/29/16 0642 12/01/16 0824  INR 1.29 1.21    Sepsis Markers No results for input(s): LATICACIDVEN, PROCALCITON, O2SATVEN in the last 168 hours.  ABG No results for input(s): PHART, PCO2ART, PO2ART in the last 168 hours.  Liver Enzymes  Recent Labs Lab 11/28/16 0936  AST 36  ALT 22  ALKPHOS 61  BILITOT 0.4  ALBUMIN 3.2*    Cardiac Enzymes  Recent Labs Lab 11/28/16 0936  TROPONINI <0.03    Glucose  Recent Labs Lab 11/30/16 1432 11/30/16 2209 12/01/16 0003 12/01/16 0414 12/01/16 0738   GLUCAP 99 102* 98 96 93    Imaging Nm Gi Blood Loss  Result Date: 11/30/2016 CLINICAL DATA:  56 year old female with GI bleed. EXAM: NUCLEAR MEDICINE GASTROINTESTINAL BLEEDING SCAN TECHNIQUE: Sequential abdominal images were obtained following intravenous administration of Tc-2241m labeled red blood cells. RADIOPHARMACEUTICALS:  26.8 mCi Tc-2841m in-vitro labeled red cells. COMPARISON:  None. FINDINGS: There is physiologic uptake in the liver, cardiac blood pool, aorta, IVC, and iliac vessels. Excreted radiopharmaceutical noted within the bladder. No abnormal uptake noted conforming to the bowel to suggest active GI bleed. IMPRESSION: No definite scintigraphic evidence of active GI bleed. Electronically Signed   By: Elgie CollardArash  Radparvar M.D.   On: 11/30/2016 23:56   I reviewed CXR myself, no acute disease noted.  ASSESSMENT / PLAN:  PULMONARY Stable on room air   CARDIOVASCULAR A:  Borderline hypotension P:  Normal saline at 50 ml/hr D/C pressors  RENAL A:   Hypokalemia Hypomag  Replace electrolytes as indicated BMET in AM NS at 50 ml/hr.  GASTROINTESTINAL A:   Upper GI bleed-status post EGD 2. Gastroenterology recommends nuclear bleeding scan. P:   Nuclear bleeding scan ordered. Per GI Protonix to 40 BID IV and d/c drip  HEMATOLOGIC A:   Anemia from blood loss  P:  Transfuse PRBC to keep hemoglobin greater than 7. H&H every 6  INFECTIOUS no signs of infection   ENDOCRINE ICU Glycemic insulin scale    FAMILY  - No family bedside  May transfer out of the ICU, PCCM will sign off, please call back if needed.  Discussed with PCCM-NP.  Alyson ReedyWesam G. Yacoub, M.D. [facility] Pulmonary/Critical Care Medicine. Pager: [phone]. After hours pager: [phone].

## 2016-12-02 ENCOUNTER — Encounter (HOSPITAL_COMMUNITY)
Admission: AD | Disposition: A | Discharge status: Other Acute Inpatient Hospital | Payer: Self-pay | Source: Other Acute Inpatient Hospital | Attending: Internal Medicine

## 2016-12-02 ENCOUNTER — Encounter (HOSPITAL_COMMUNITY): Payer: Self-pay

## 2016-12-02 ENCOUNTER — Inpatient Hospital Stay (HOSPITAL_COMMUNITY): Payer: BLUE CROSS/BLUE SHIELD

## 2016-12-02 DIAGNOSIS — K254 Chronic or unspecified gastric ulcer with hemorrhage: Secondary | ICD-10-CM | POA: Diagnosis not present

## 2016-12-02 DIAGNOSIS — K250 Acute gastric ulcer with hemorrhage: Secondary | ICD-10-CM | POA: Diagnosis not present

## 2016-12-02 DIAGNOSIS — K922 Gastrointestinal hemorrhage, unspecified: Secondary | ICD-10-CM | POA: Diagnosis not present

## 2016-12-02 DIAGNOSIS — R262 Difficulty in walking, not elsewhere classified: Secondary | ICD-10-CM | POA: Diagnosis not present

## 2016-12-02 DIAGNOSIS — I10 Essential (primary) hypertension: Secondary | ICD-10-CM | POA: Diagnosis not present

## 2016-12-02 DIAGNOSIS — K264 Chronic or unspecified duodenal ulcer with hemorrhage: Secondary | ICD-10-CM | POA: Diagnosis not present

## 2016-12-02 DIAGNOSIS — K5790 Diverticulosis of intestine, part unspecified, without perforation or abscess without bleeding: Secondary | ICD-10-CM | POA: Diagnosis not present

## 2016-12-02 DIAGNOSIS — K259 Gastric ulcer, unspecified as acute or chronic, without hemorrhage or perforation: Secondary | ICD-10-CM | POA: Diagnosis not present

## 2016-12-02 DIAGNOSIS — D689 Coagulation defect, unspecified: Secondary | ICD-10-CM | POA: Diagnosis not present

## 2016-12-02 DIAGNOSIS — D500 Iron deficiency anemia secondary to blood loss (chronic): Secondary | ICD-10-CM | POA: Diagnosis not present

## 2016-12-02 DIAGNOSIS — D62 Acute posthemorrhagic anemia: Secondary | ICD-10-CM | POA: Diagnosis not present

## 2016-12-02 DIAGNOSIS — K921 Melena: Secondary | ICD-10-CM | POA: Diagnosis not present

## 2016-12-02 DIAGNOSIS — K5730 Diverticulosis of large intestine without perforation or abscess without bleeding: Secondary | ICD-10-CM | POA: Diagnosis not present

## 2016-12-02 DIAGNOSIS — E611 Iron deficiency: Secondary | ICD-10-CM | POA: Diagnosis not present

## 2016-12-02 DIAGNOSIS — T182XXA Foreign body in stomach, initial encounter: Secondary | ICD-10-CM | POA: Diagnosis not present

## 2016-12-02 DIAGNOSIS — D696 Thrombocytopenia, unspecified: Secondary | ICD-10-CM | POA: Diagnosis not present

## 2016-12-02 DIAGNOSIS — T39395A Adverse effect of other nonsteroidal anti-inflammatory drugs [NSAID], initial encounter: Secondary | ICD-10-CM | POA: Diagnosis not present

## 2016-12-02 HISTORY — PX: ESOPHAGOGASTRODUODENOSCOPY: SHX5428

## 2016-12-02 LAB — CBC
HCT: 20.6 % — ABNORMAL LOW (ref 36.0–46.0)
HEMATOCRIT: 23.7 % — AB (ref 36.0–46.0)
HEMATOCRIT: 22.8 % — AB (ref 36.0–46.0)
HEMOGLOBIN: 7.9 g/dL — AB (ref 12.0–15.0)
Hemoglobin: 6.7 g/dL — CL (ref 12.0–15.0)
Hemoglobin: 7.7 g/dL — ABNORMAL LOW (ref 12.0–15.0)
MCH: 29.1 pg (ref 26.0–34.0)
MCH: 29.2 pg (ref 26.0–34.0)
MCH: 30.3 pg (ref 26.0–34.0)
MCHC: 32.5 g/dL (ref 30.0–36.0)
MCHC: 33.3 g/dL (ref 30.0–36.0)
MCHC: 33.8 g/dL (ref 30.0–36.0)
MCV: 87.5 fL (ref 78.0–100.0)
MCV: 89.6 fL (ref 78.0–100.0)
MCV: 89.8 fL (ref 78.0–100.0)
PLATELETS: 160 10*3/uL (ref 150–400)
Platelets: 107 10*3/uL — ABNORMAL LOW (ref 150–400)
Platelets: 138 10*3/uL — ABNORMAL LOW (ref 150–400)
RBC: 2.3 MIL/uL — ABNORMAL LOW (ref 3.87–5.11)
RBC: 2.54 MIL/uL — ABNORMAL LOW (ref 3.87–5.11)
RBC: 2.71 MIL/uL — ABNORMAL LOW (ref 3.87–5.11)
RDW: 15.3 % (ref 11.5–15.5)
RDW: 16.7 % — ABNORMAL HIGH (ref 11.5–15.5)
RDW: 17.3 % — ABNORMAL HIGH (ref 11.5–15.5)
WBC: 10.4 10*3/uL (ref 4.0–10.5)
WBC: 11.6 10*3/uL — ABNORMAL HIGH (ref 4.0–10.5)
WBC: 9.3 10*3/uL (ref 4.0–10.5)

## 2016-12-02 LAB — GLUCOSE, CAPILLARY: Glucose-Capillary: 109 mg/dL — ABNORMAL HIGH (ref 65–99)

## 2016-12-02 LAB — BASIC METABOLIC PANEL
ANION GAP: 3 — AB (ref 5–15)
CALCIUM: 8 mg/dL — AB (ref 8.9–10.3)
CO2: 24 mmol/L (ref 22–32)
Chloride: 111 mmol/L (ref 101–111)
Creatinine, Ser: 0.83 mg/dL (ref 0.44–1.00)
GFR calc Af Amer: >60 mL/min (ref >60)
GFR calc non Af Amer: >60 mL/min (ref >60)
GLUCOSE: 101 mg/dL — AB (ref 65–99)
Potassium: 3.5 mmol/L (ref 3.5–5.1)
Sodium: 138 mmol/L (ref 135–145)

## 2016-12-02 LAB — PREPARE RBC (CROSSMATCH)

## 2016-12-02 SURGERY — EGD (ESOPHAGOGASTRODUODENOSCOPY)
Anesthesia: Moderate Sedation

## 2016-12-02 MED ORDER — PANTOPRAZOLE SODIUM 40 MG IV SOLR
40.0000 mg | Freq: Two times a day (BID) | INTRAVENOUS | Status: DC
  Filled 2016-12-02: qty 40

## 2016-12-02 MED ORDER — SODIUM CHLORIDE 0.9 % IV SOLN
Freq: Once | INTRAVENOUS | Status: AC
  Administered 2016-12-02: 16:00:00 via INTRAVENOUS

## 2016-12-02 MED ORDER — MIDAZOLAM HCL 10 MG/2ML IJ SOLN
INTRAMUSCULAR | Status: DC | PRN
  Administered 2016-12-02 (×2): 2 mg via INTRAVENOUS

## 2016-12-02 MED ORDER — FERROUS SULFATE 325 (65 FE) MG PO TABS: 325.0000 mg | ORAL_TABLET | Freq: Two times a day (BID) | ORAL | 0 refills | Status: DC

## 2016-12-02 MED ORDER — MIDAZOLAM HCL 5 MG/ML IJ SOLN
INTRAMUSCULAR | Status: AC
  Filled 2016-12-02: qty 2

## 2016-12-02 MED ORDER — FENTANYL CITRATE (PF) 100 MCG/2ML IJ SOLN
INTRAMUSCULAR | Status: AC
  Filled 2016-12-02: qty 2

## 2016-12-02 MED ORDER — PANTOPRAZOLE SODIUM 40 MG IV SOLR: 40.0000 mg | Freq: Two times a day (BID) | INTRAVENOUS | Status: DC

## 2016-12-02 MED ORDER — PANTOPRAZOLE SODIUM 40 MG PO TBEC: 40.0000 mg | DELAYED_RELEASE_TABLET | Freq: Two times a day (BID) | ORAL | Status: DC

## 2016-12-02 MED ORDER — PANTOPRAZOLE SODIUM 40 MG PO TBEC: 40.0000 mg | DELAYED_RELEASE_TABLET | Freq: Two times a day (BID) | ORAL | 1 refills | Status: DC

## 2016-12-02 MED ORDER — FENTANYL CITRATE (PF) 100 MCG/2ML IJ SOLN
INTRAMUSCULAR | Status: DC | PRN
  Administered 2016-12-02: 25 ug via INTRAVENOUS

## 2016-12-02 NOTE — Op Note (Signed)
[facility] Patient Name: Cathy Clark Procedure Date : 12/02/2016 MRN: [id number] Attending MD: Meryl Dare , MD Date of Birth: [birth year] CSN: [id number] Age: 56 Admit Type: Inpatient Procedure:                Upper GI endoscopy Indications:              Hematochezia Providers:                Venita Lick. Russella Dar, MD, Omelia Blackwater, RN, Arlee Muslim, Technician, Margo Aye, Technician Referring MD:             [facility] Medicines:                Fentanyl 50 micrograms IV, Midazolam 4 mg IV Complications:            No immediate complications. Estimated Blood Loss:     Estimated blood loss was minimal. Procedure:                Pre-Anesthesia Assessment:                           - Prior to the procedure, a History and Physical                            was performed, and patient medications and                            allergies were reviewed. The patient's tolerance of                            previous anesthesia was also reviewed. The risks                            and benefits of the procedure and the sedation                            options and risks were discussed with the patient.                            All questions were answered, and informed consent                            was obtained. Prior Anticoagulants: The patient has                            taken no previous anticoagulant or antiplatelet                            agents. ASA Grade Assessment: II - A patient with                            mild systemic disease. After reviewing the risks  and benefits, the patient was deemed in                            satisfactory condition to undergo the procedure.                           After obtaining informed consent, the endoscope was                            passed under direct vision. Throughout the                            procedure, the patient's blood  pressure, pulse, and                            oxygen saturations were monitored continuously. The                            Endosonoscope was introduced through the mouth, and                            advanced to the third part of duodenum. The upper                            GI endoscopy was accomplished without difficulty.                            The patient tolerated the procedure well. Scope In: Scope Out: Findings:      The examined esophagus was normal.      Two non-bleeding superficial gastric ulcers each with 2 previously       placed clips were found in the gastric antrum. One ulcer had a non       bleeding visible vessel at the edge of the ulcer and at a separate site       from the 2 previously placed clips. 2 hemostatic clips were successfully       placed (MR conditional), in good position around the visible vessel. 1       of the clips placed at prior EGDs became dislodged passing the scope to       the duodenum. The site of dislodged clip did not show stigmata of recent       bleeding. The largest ulcer was 10 mm in largest dimension. There was no       bleeding at the end of the procedure.      A small protuberant lesion, suspected Dieulafoy lesion, with mild oozing       bleeding was found in the gastric fundus. For hemostasis, two hemostatic       clips were successfully placed (MR conditional). There was no bleeding       at the end of the procedure. This area had only a small amount of blood       and no other blood was noted in the UGI tract.      A small hiatal hernia was present.      The exam of the stomach was otherwise normal.      The duodenal bulb, second  portion of the duodenum and third portion of       the duodenum were normal. Impression:               - Normal esophagus.                           - Two non-bleeding gastric ulcers, one with a                            visible vessel. Clips (MR conditional) were placed.                            - Dieulafoy lesion of stomach. Clips (MR                            conditional) were placed.                           - Small hiatal hernia.                           - Normal duodenal bulb, second portion of the                            duodenum and third portion of the duodenum.                           - No specimens collected. Moderate Sedation:      Moderate (conscious) sedation was administered by the endoscopy nurse       and supervised by the endoscopist. The following parameters were       monitored: oxygen saturation, heart rate, blood pressure, respiratory       rate, EKG, adequacy of pulmonary ventilation, and response to care.       Total physician intraservice time was 13 minutes. Recommendation:           - Return patient to ICU for ongoing care.                           - Protonix (pantoprazole): initiate therapy with 80                            mg IV bolus, then 8 mg/hr IV by continuous infusion                            today.                           - Clear liquid diet today.                           - Continue present medications. Procedure Code(s):        --- Professional ---                           [phone], Esophagogastroduodenoscopy, flexible,  transoral; with control of bleeding, any method                           99152, Moderate sedation services provided by the                            same physician or other qualified health care                            professional performing the diagnostic or                            therapeutic service that the sedation supports,                            requiring the presence of an independent trained                            observer to assist in the monitoring of the                            patient's level of consciousness and physiological                            status; initial 15 minutes of intraservice time,                            patient age 65 years or  older Diagnosis Code(s):        --- Professional ---                           K25.4, Chronic or unspecified gastric ulcer with                            hemorrhage                           K31.82, Dieulafoy lesion (hemorrhagic) of stomach                            and duodenum                           K44.9, Diaphragmatic hernia without obstruction or                            gangrene                           K92.1, Melena (includes Hematochezia) CPT copyright 2016 [employer] Medical Association. All rights reserved. The codes documented in this report are preliminary and upon coder review may  be revised to meet current compliance requirements. Meryl Dare, MD 12/02/2016 7:01:22 PM This report has been signed electronically. Number of Addenda: 0

## 2016-12-02 NOTE — Progress Notes (Addendum)
Pt was stable and discharged about an hour ago and since then had Bright red blood per rectum -will cancel discharge -check CBC this afternoon, transfuse if BP or Hb drops, transfer to ICU if bleeds again -called and d/w [facility], they agree with Bleeding scan, monitor Hb  Zannie CovePreetha Cai Anfinson, MD

## 2016-12-02 NOTE — Procedures (Signed)
Central Venous Catheter Insertion Procedure Note Cathy Clark [id number] 06/16/1961  Procedure: Insertion of Central Venous Catheter Indications: Drug and/or fluid administration and Frequent blood sampling  Procedure Details Consent: Risks of procedure as well as the alternatives and risks of each were explained to the (patient/caregiver).  Consent for procedure obtained. Time Out: Verified patient identification, verified procedure, site/side was marked, verified correct patient position, special equipment/implants available, medications/allergies/relevent history reviewed, required imaging and test results available.  Performed  Maximum sterile technique was used including antiseptics, cap, gloves, gown, hand hygiene, mask and sheet. Skin prep: Chlorhexidine; local anesthetic administered A antimicrobial bonded/coated triple lumen catheter was placed in the left internal jugular vein using the Seldinger technique. Ultrasound guidance used.Yes.   Catheter placed to 20 cm. Blood aspirated via all 3 ports and then flushed x 3. Line sutured x 2 and dressing applied.  Evaluation Blood flow good Complications: No apparent complications Patient did tolerate procedure well. Chest X-Cathy ordered to verify placement.  CXR: pending.  Joneen RoachPaul Hoffman, AGACNP-BC [facility] Pulmonology/Critical Care Pager [phone] or [phone]  12/02/2016 6:01 PM

## 2016-12-02 NOTE — H&P (View-Only) (Signed)
Daily Rounding Note  12/02/2016, 11:08 AM  LOS: 4 days   SUBJECTIVE:   Chief complaint:  Felt a little bit dizzy when she got up to walk to the bathroom yesterday. Hasn't been up out of bed today yet. Eating solid food without problems. Did not have any bowel movements yesterday or today, so no bowel movements since she had the recurrent GI bleed on Sunday.     OBJECTIVE:         Vital signs in last 24 hours:    Temp:  [98.2 F (36.8 C)-99.2 F (37.3 C)] 98.2 F (36.8 C) (06/18 1934) Pulse Rate:  [67-82] 78 (06/19 0300) Resp:  [14-18] 17 (06/19 0300) BP: (106-118)/(63-84) 117/69 (06/19 0930) SpO2:  [96 %-100 %] 96 % (06/19 0930) Weight:  [63.1 kg (139 lb 1.6 oz)] 63.1 kg (139 lb 1.6 oz) (06/19 0300) Last BM Date: 11/30/16 Filed Weights   11/28/16 1600 12/01/16 0419 12/02/16 0300  Weight: 61.2 kg (135 lb) 66.7 kg (147 lb 0.8 oz) 63.1 kg (139 lb 1.6 oz)   General: Thin, does not look ill.   Heart: RRR. Chest: Clear bilaterally. No labored breathing, no cough. Abdomen: Thin. Not tender or distended. Active bowel sounds.  Extremities: No CCE. Neuro/Psych:  Alert. Oriented times 3. No gross deficits or tremors.  Intake/Output from previous day: 06/18 0701 - 06/19 0700 In: 1366.7 [P.O.:240; I.V.:1076.7; IV Piggyback:50] Out: 1225 [Urine:1225]  Intake/Output this shift: Total I/O In: 240 [P.O.:240] Out: 400 [Urine:400]  Lab Results:  Recent Labs  12/01/16 0824 12/01/16 1545 12/02/16 0030  WBC 11.4* 12.2* 11.6*  HGB 7.8* 7.8* 7.9*  HCT 22.6* 23.0* 23.7*  PLT 92* 115* 138*   BMET  Recent Labs  11/29/16 2143 12/01/16 0824 12/02/16 0000  NA 135 139 138  K 3.3* 3.2* 3.5  CL 109 113* 111  CO2 23 23 24   GLUCOSE 125* 104* 101*  BUN 8 6 <5*  CREATININE 0.75 0.75 0.83  CALCIUM 7.3* 7.3* 8.0*   LFT No results for input(s): PROT, ALBUMIN, AST, ALT, ALKPHOS, BILITOT, BILIDIR, IBILI in the last 72  hours. PT/INR  Recent Labs  12/01/16 0824  LABPROT 15.4*  INR 1.21   Hepatitis Panel No results for input(s): HEPBSAG, HCVAB, HEPAIGM, HEPBIGM in the last 72 hours.  Studies/Results: Nm Gi Blood Loss  Result Date: 11/30/2016 CLINICAL DATA:  56 year old female with GI bleed. EXAM: NUCLEAR MEDICINE GASTROINTESTINAL BLEEDING SCAN TECHNIQUE: Sequential abdominal images were obtained following intravenous administration of Tc-82m labeled red blood cells. RADIOPHARMACEUTICALS:  26.8 mCi Tc-83m in-vitro labeled red cells. COMPARISON:  None. FINDINGS: There is physiologic uptake in the liver, cardiac blood pool, aorta, IVC, and iliac vessels. Excreted radiopharmaceutical noted within the bladder. No abnormal uptake noted conforming to the bowel to suggest active GI bleed. IMPRESSION: No definite scintigraphic evidence of active GI bleed. Electronically Signed   By: Elgie Collard M.D.   On: 11/30/2016 23:56   Scheduled Meds: . pantoprazole  40 mg Intravenous Q12H   Continuous Infusions: . sodium chloride     PRN Meds:.sodium chloride, acetaminophen, albuterol, ondansetron (ZOFRAN) IV, ondansetron **OR** [DISCONTINUED] ondansetron (ZOFRAN) IV   ASSESMENT:   *  GI bleed.  Hematochezia.   11/29/16 EGD.  Non-bleeding antral, lesser curvature ulcers with VV.  Bicapped and clipped x 1.  Biopsy obtained.  11/29/16 Colonoscopy: old and fresh blood present, less intense in proximal colon.  No masses, ulcers or tics seen.  Re-bled  and retransfused on 6/17.   11/30/16 EGD #2.  No blood seen.  Two ulcers noted, not bleeding.  Additonal endoclips placed at ulcers due to severity of recurrent bleeding.    Protonix IV BID started 6/15, switched to gtt 6/17 at 1500  *  Blood loss anemia.  Hgb remains stable.  S/p PRBC x 7.  *  Thrombocytopenia.  Improving.     PLAN   *  Awaiting path report from EGD.  If H. pylori is present, will need to complete a course of antibiotics.    *  ? Home today  or tomorrow.  Will need CBC next week to assure Hgb headed up.   Will arrange ROV with GI in ~ 1 month.     *  Avoid aspirin and nonsteroidal anti-inflammatory medications  *  Switched to oral Protonix. Should take this twice a day for one month.   Jennye MoccasinSarah Gribbin  12/02/2016, 11:08 AM Pager: [phone]     Attending physician's note   I have taken an interval history, reviewed the chart and examined the patient. I agree with the Advanced Practitioner's note, impression and recommendations. Gastric ulcers with acute bleed resolved. ABL anemia is stable.  Change to PPI po bid for 1 month and then PPI qam long term. NO ASA/NSAIDs. Await gastric biopsies, if H pylori noted proceed with treatment.   If stable should be OK for discharge later today or tomorrow.  GI office follow up in 1 month with Dr. Stan Headarl Gessner. EGD in 2-3 months to document ulcer healing.  GI signing off.   Claudette HeadMalcolm Berlin Viereck, MD Clementeen GrahamFACG [phone] Mon-Fri 8a-5p [phone] after 5p, weekends, holidays

## 2016-12-02 NOTE — Interval H&P Note (Signed)
History and Physical Interval Note:  12/02/2016 6:00 PM  Cathy Clark  has presented today for surgery, with the diagnosis of GI bleed  The various methods of treatment have been discussed with the patient and family. After consideration of risks, benefits and other options for treatment, the patient has consented to  Procedure(s): ESOPHAGOGASTRODUODENOSCOPY (EGD) (N/A) as a surgical intervention .  The patient's history has been reviewed, patient examined, no change in status, stable for surgery.  I have reviewed the patient's chart and labs.  Questions were answered to the patient's satisfaction.     Venita LickMalcolm T. Russella DarStark

## 2016-12-02 NOTE — Progress Notes (Signed)
Family updated on patients status post EGD and line placement.  Also informed patients family [facility]

## 2016-12-02 NOTE — Progress Notes (Signed)
Daily Rounding Note  12/02/2016, 11:08 AM  LOS: 4 days   SUBJECTIVE:   Chief complaint:  Felt a little bit dizzy when she got up to walk to the bathroom yesterday. Hasn't been up out of bed today yet. Eating solid food without problems. Did not have any bowel movements yesterday or today, so no bowel movements since she had the recurrent GI bleed on Sunday.     OBJECTIVE:         Vital signs in last 24 hours:    Temp:  [98.2 F (36.8 C)-99.2 F (37.3 C)] 98.2 F (36.8 C) (06/18 1934) Pulse Rate:  [67-82] 78 (06/19 0300) Resp:  [14-18] 17 (06/19 0300) BP: (106-118)/(63-84) 117/69 (06/19 0930) SpO2:  [96 %-100 %] 96 % (06/19 0930) Weight:  [63.1 kg (139 lb 1.6 oz)] 63.1 kg (139 lb 1.6 oz) (06/19 0300) Last BM Date: 11/30/16 Filed Weights   11/28/16 1600 12/01/16 0419 12/02/16 0300  Weight: 61.2 kg (135 lb) 66.7 kg (147 lb 0.8 oz) 63.1 kg (139 lb 1.6 oz)   General: Thin, does not look ill.   Heart: RRR. Chest: Clear bilaterally. No labored breathing, no cough. Abdomen: Thin. Not tender or distended. Active bowel sounds.  Extremities: No CCE. Neuro/Psych:  Alert. Oriented times 3. No gross deficits or tremors.  Intake/Output from previous day: 06/18 0701 - 06/19 0700 In: 1366.7 [P.O.:240; I.V.:1076.7; IV Piggyback:50] Out: 1225 [Urine:1225]  Intake/Output this shift: Total I/O In: 240 [P.O.:240] Out: 400 [Urine:400]  Lab Results:  Recent Labs  12/01/16 0824 12/01/16 1545 12/02/16 0030  WBC 11.4* 12.2* 11.6*  HGB 7.8* 7.8* 7.9*  HCT 22.6* 23.0* 23.7*  PLT 92* 115* 138*   BMET  Recent Labs  11/29/16 2143 12/01/16 0824 12/02/16 0000  NA 135 139 138  K 3.3* 3.2* 3.5  CL 109 113* 111  CO2 23 23 24   GLUCOSE 125* 104* 101*  BUN 8 6 <5*  CREATININE 0.75 0.75 0.83  CALCIUM 7.3* 7.3* 8.0*   LFT No results for input(s): PROT, ALBUMIN, AST, ALT, ALKPHOS, BILITOT, BILIDIR, IBILI in the last 72  hours. PT/INR  Recent Labs  12/01/16 0824  LABPROT 15.4*  INR 1.21   Hepatitis Panel No results for input(s): HEPBSAG, HCVAB, HEPAIGM, HEPBIGM in the last 72 hours.  Studies/Results: Nm Gi Blood Loss  Result Date: 11/30/2016 CLINICAL DATA:  56 year old female with GI bleed. EXAM: NUCLEAR MEDICINE GASTROINTESTINAL BLEEDING SCAN TECHNIQUE: Sequential abdominal images were obtained following intravenous administration of Tc-82m labeled red blood cells. RADIOPHARMACEUTICALS:  26.8 mCi Tc-83m in-vitro labeled red cells. COMPARISON:  None. FINDINGS: There is physiologic uptake in the liver, cardiac blood pool, aorta, IVC, and iliac vessels. Excreted radiopharmaceutical noted within the bladder. No abnormal uptake noted conforming to the bowel to suggest active GI bleed. IMPRESSION: No definite scintigraphic evidence of active GI bleed. Electronically Signed   By: Elgie Collard M.D.   On: 11/30/2016 23:56   Scheduled Meds: . pantoprazole  40 mg Intravenous Q12H   Continuous Infusions: . sodium chloride     PRN Meds:.sodium chloride, acetaminophen, albuterol, ondansetron (ZOFRAN) IV, ondansetron **OR** [DISCONTINUED] ondansetron (ZOFRAN) IV   ASSESMENT:   *  GI bleed.  Hematochezia.   11/29/16 EGD.  Non-bleeding antral, lesser curvature ulcers with VV.  Bicapped and clipped x 1.  Biopsy obtained.  11/29/16 Colonoscopy: old and fresh blood present, less intense in proximal colon.  No masses, ulcers or tics seen.  Re-bled  and retransfused on 6/17.   11/30/16 EGD #2.  No blood seen.  Two ulcers noted, not bleeding.  Additonal endoclips placed at ulcers due to severity of recurrent bleeding.    Protonix IV BID started 6/15, switched to gtt 6/17 at 1500  *  Blood loss anemia.  Hgb remains stable.  S/p PRBC x 7.  *  Thrombocytopenia.  Improving.     PLAN   *  Awaiting path report from EGD.  If H. pylori is present, will need to complete a course of antibiotics.    *  ? Home today  or tomorrow.  Will need CBC next week to assure Hgb headed up.   Will arrange ROV with GI in ~ 1 month.     *  Avoid aspirin and nonsteroidal anti-inflammatory medications  *  Switched to oral Protonix. Should take this twice a day for one month.   Jennye MoccasinSarah Gribbin  12/02/2016, 11:08 AM Pager: [phone]     Attending physician's note   I have taken an interval history, reviewed the chart and examined the patient. I agree with the Advanced Practitioner's note, impression and recommendations. Gastric ulcers with acute bleed resolved. ABL anemia is stable.  Change to PPI po bid for 1 month and then PPI qam long term. NO ASA/NSAIDs. Await gastric biopsies, if H pylori noted proceed with treatment.   If stable should be OK for discharge later today or tomorrow.  GI office follow up in 1 month with Dr. Stan Headarl Gessner. EGD in 2-3 months to document ulcer healing.  GI signing off.   Claudette HeadMalcolm Arabia Nylund, MD Clementeen GrahamFACG [phone] Mon-Fri 8a-5p [phone] after 5p, weekends, holidays

## 2016-12-02 NOTE — Progress Notes (Signed)
PULMONARY / CRITICAL CARE MEDICINE   Name: Cathy Clark MRN: [id number] DOB: 11/14/1960    ADMISSION DATE:  11/28/2016 CONSULTATION DATE:  6/17  REFERRING MD:  Jomarie LongsJoseph  CHIEF COMPLAINT:  Recurrent GI bleed   HISTORY OF PRESENT ILLNESS:   This is a 56 year old female who was admitted on 6/15 w/ GIB/ hematochezia   Hospital course: 11/29/16 EGD. Non-bleeding antral, lesser curvature ulcers with VV. Bicapped and clipped x 1. Biopsy obtained.  11/29/16 Colonoscopy: old and fresh blood present, less intense in proximal colon. No masses, ulcers or tics seen.  Re-bled and retransfused on 6/17.  11/30/16 EGD #2. No blood seen. Two ulcers noted, not bleeding. Additonal endoclips placed at ulcers due to severity of recurrent bleeding.  6/18 PCCM s/o 6/19 was to go home w/ plans for PPI BID, avoid NSAIDs having received total of 7 units of PRBCs during admit. About 1 hr after dc papers signed [name]. Hgb down to 6.7. Bleeding scan ordered (which patient refused). Transferred to the intensive care given concern for re-bleed.    SUBJECTIVE:  Feels "like she is going to faint"  VITAL SIGNS: BP 105/73   Pulse 78   Temp 98.2 F (36.8 C) (Oral)   Resp 16   Ht 5\' 5"  (1.651 m)   Wt 139 lb 1.6 oz (63.1 kg)   LMP 12/13/2014   SpO2 96%   BMI 23.15 kg/m  Room air  INTAKE / OUTPUT: I/O last 3 completed shifts: In: 2266.7 [P.O.:240; I.V.:1976.7; IV Piggyback:50] Out: 1300 [Urine:1300]  PHYSICAL EXAMINATION: General appearance:  56 Year old  female, well nourished currently in acute distress,  conversant  Eyes: anicteric sclerae, moist conjunctivae; PERRL, EOMI bilaterally. Mouth:  membranes and no mucosal ulcerations; normal hard and soft palate Neck: Trachea midline; neck supple, no JVD Lungs/chest: Crackles bases, with normal respiratory effort and no intercostal retractions CV: tachy RRR, no MRGs  Abdomen: Soft, non-tender; no masses or HSM Extremities: No  peripheral edema or extremity lymphadenopathy Skin: Normal temperature, turgor and texture; no rash, ulcers or subcutaneous nodules Neuro/ Psych: Appropriate affect, alert and oriented to person, place and time   LABS:  BMET  Recent Labs Lab 11/29/16 2143 12/01/16 0824 12/02/16 0000  NA 135 139 138  K 3.3* 3.2* 3.5  CL 109 113* 111  CO2 23 23 24   BUN 8 6 <5*  CREATININE 0.75 0.75 0.83  GLUCOSE 125* 104* 101*    Electrolytes  Recent Labs Lab 11/29/16 2143 12/01/16 0824 12/02/16 0000  CALCIUM 7.3* 7.3* 8.0*  MG  --  1.5*  --   PHOS  --  2.6  --     CBC  Recent Labs Lab 12/01/16 1545 12/02/16 0030 12/02/16 1433  WBC 12.2* 11.6* 10.4  HGB 7.8* 7.9* 6.7*  HCT 23.0* 23.7* 20.6*  PLT 115* 138* 160    Coag's  Recent Labs Lab 11/29/16 0642 12/01/16 0824  INR 1.29 1.21    Sepsis Markers No results for input(s): LATICACIDVEN, PROCALCITON, O2SATVEN in the last 168 hours.  ABG No results for input(s): PHART, PCO2ART, PO2ART in the last 168 hours.  Liver Enzymes  Recent Labs Lab 11/28/16 0936  AST 36  ALT 22  ALKPHOS 61  BILITOT 0.4  ALBUMIN 3.2*    Cardiac Enzymes  Recent Labs Lab 11/28/16 0936  TROPONINI <0.03    Glucose  Recent Labs Lab 11/30/16 1432 11/30/16 2209 12/01/16 0003 12/01/16 0414 12/01/16 0738  GLUCAP 99  102* 98 96 93    Imaging No results found.   STUDIES:  11/29/16 EGD. Non-bleeding antral, lesser curvature ulcers with VV. Bicapped and clipped x 1. Biopsy obtained.  11/29/16 Colonoscopy: old and fresh blood present, less intense in proximal colon. No masses, ulcers or tics seen.  Re-bled and retransfused on 6/17.  6/17 nuclear med scan. Negative  11/30/16 EGD #2. No blood seen. Two ulcers noted, not bleeding. Additonal endoclips placed at ulcers due to severity of recurrent bleeding.   CULTURES:   ANTIBIOTICS:   SIGNIFICANT EVENTS:   LINES/TUBES:  DISCUSSION: Recurrent GIB. Felt initially  felt to be UGIB but wonder if we are missing either a lower GIB element or something in the small bowel. She is refusing bleeding scan.  For now-->transfuse  -discuss bleeding scan w. Family -ICU level care.  -[facility] accepts to ICU [facility]delle Anton) bed possibly available tonight.   ASSESSMENT / PLAN:  Gastrointestinal:  Recurrent UGI Bleed w/ resultant Acute blood loss anemia. ? Are we missing Lower GI component or bleeding source in the small intestine?  She is s/p EGD X 2 w/ endoclips  Plan Cont PPI BID IV No NSAIDs Blood per below.  GI following.  NPO Serial CBCs  Heme:  ABLA  Plan Trend cbc  Cardiovascular:  Hypovolemic/ hemorrhagic shock (resopnded to IVF) -hgb dropped from 7.9 to 6.7. -transfusion pending  Plan Transfuse 2 units.  Cont ICU monitoring  Maintain IV access Hold antihypertensives.   Renal:  At risk for fluid & electrolyte imbalance  Plan Recheck and replace as indicated  Avoid hypotension   Neuro:  Fatigue/pre-syncope Plan Correct hypotension Hold sedating meds   My ccm time 32 minutes  Simonne Martinet ACNP-BC [facility] Pulmonary/Critical Care Pager # [phone] OR # [phone] if no answer   12/02/2016, 3:48 PM

## 2016-12-02 NOTE — Progress Notes (Addendum)
Has had 2 BMs in last 30 minutes that are dark and bloody.  Vitals stable but noted to be tired and weak during walk in halls 2 hours previous.   Second stool seen by myself, dark red blood in commode, looking same as the first captured in phone based photo but pt's dtr. Remain in hospital.  CBC this afternoon and in AM. Stay on BID Protonix.   Not planning repeat EGD today unless unstable.  Regress diet to clears in case EGD necessary.    More dark red stool this afternoon, now associated with hypotension. Transfer to ICU and CCM team. IV fluid resusitation. Transfuse to keep Hb > 8. I'm concerned she might a source of GI bleeding distal to the UGI tract. Known 2 gastric ulcers. Recommended a stat GI bleeding scan which she refused. [facility] and [facility] and that is in process. Case discussed with CCM team. Central line will be placed. Recommended emergent EGD to assess for recurrent GU or other UGI source of bleed. Pt consents to proceed as long as it doesn't interfere [facility].    Jennye MoccasinSarah Gribbin PA-C.

## 2016-12-02 NOTE — OR Nursing (Signed)
Wasted 75 mcg Fentanyl, 1 mg versed. Witnessed by Shireen Quanobert Ashaad Gaertner, RN.  Omelia BlackwaterShelby Carpenter, RN

## 2016-12-02 NOTE — Progress Notes (Signed)
Patient had bright red bowel movement prior to discharge. GI PA was paged, she recommended a CBC at 1500 and keeping the patient here one more day. I have attempted to page Dr. Jomarie LongsJoseph ([facility]) twice to give an update on the patient's condition.

## 2016-12-02 NOTE — Discharge Summary (Signed)
Physician Discharge Summary  Cathy Clark ZOX:[id number] DOB: 05-Sep-1960 DOA: 11/28/2016  PCP: Cathy Sams, DO  Admit date: 11/28/2016 Discharge date: 12/02/2016  Time spent: 45 minutes  Recommendations for Outpatient Follow-up:  1. TRANSFER to [facility] ICU-accepted by Cathy Clark, per pt and Family insistance, despite understanding risks of destabilizing bleeding, shock, CVA, MI, death  Discharge Diagnoses:    Hypovolemic shock   Clark Bleed   Acute Blood loss anemia   Essential hypertension   Clark bleed   Acute blood loss anemia   Hematochezia   Multiple gastric ulcers   Acute gastric ulcer with hemorrhage   Discharge Condition: guarded  Diet recommendation: NPO  Filed Weights   11/28/16 1600 12/01/16 0419 12/02/16 0300  Weight: 61.2 kg (135 lb) 66.7 kg (147 lb 0.8 oz) 63.1 kg (139 lb 1.6 oz)    History of present illness:  56 year old female with history of hypertension, occasional headaches uses Goody powders multiple times a week admitted with multiple episodes of bright red blood per rectum occasionally mixed with darker stools for 3-4 days Hemoglobin 7.8 outside hospital transfused 1 unit of PRBC and transferred to [facility],  Hospital Course:  1. Upper Clark bleeding/unclear if she could be having a small intestinal bleed too -h/o Goody powder use, no ETOH, Coags normal on admission -admitted, started on Protonix drip, Clark consulted -EGD 6/16 with Non-bleeding gastric ulcers with a visible vessel. Treated with bipolar cautery. Clip (MR unsafe) was placed. -Colonoscopy 6/16 was unremarkable -6/17 had 2 episodes of profuse bleeding with hypotension, transferred to ICU, urgent EGD done by Cathy Clark, no active bleeding noted, additional hemoclips placed at the ulcer site -given 8units PRBC in 2 days, then stabilized without bleeding for 48hours and stable Hb,  -6/19 afternoon: started bleeding again with 3-4 episodes of bleeding in 1.5 hours, getting 2 units  PRBC now, with repeat Hb this afternoon of 6.7, stat bleeding scan ordered but Family refused this while she was in nuclear medicine today and is adamantly requesting a Chief Strategy Officer, despite ongoing bleeding. -I transferred pt to ICU, she was just seen by Critical care team who is planning a Central line -I called [facility] transfer Line and spoke to Cathy Clark Attending Cathy Clark, he is willing to accept patient in transfer today, if medically stable, after blood transfusion now, she may start low dose pressors if needed between now and transfer depending on BP in the next few hours. - Myself and Cathy Clark -critical care attending, explained to Patient's family, her husband and daughter that despite stabilization, it is impossible for [place] to predict if she will remain stable in route to [facility], and explained risk of hypotension, shock, CVA, death and numerous other complications  2. Acute blood loss anemia -Secondary to above,  -s/p 8 units PRBC this admission -transfusing 2 more units of PRBC now  3.  Essential hypertension now hypotensive  -responding to blood transfusion - holding ARB  4. Mild thombocytopenia -This is acute suspect related to consumption  Consultations:  Cathy Clark  Cathy Clark  Discharge Exam: Vitals:   12/02/16 1637 12/02/16 1700  BP: (!) 83/49 100/62  Pulse: (!) 102   Resp: 20   Temp: 100.1 F (37.8 C) 99.6 F (37.6 C)    General: somnolent, arousable Cardiovascular: S1S2/Tachycardic Respiratory: diminished BS at bases  Discharge Instructions    Current Discharge Medication List    START taking these medications   Details  pantoprazole (PROTONIX) 40 MG injection Inject 40 mg into the  vein every 12 (twelve) hours. Qty: 1 each      STOP taking these medications     Aspirin-Acetaminophen-Caffeine (GOODY HEADACHE PO)      EPINEPHrine 0.3 mg/0.3 mL IJ SOAJ injection      losartan (COZAAR) 50 MG tablet        Allergies  Allergen Reactions  . Shrimp  [Shellfish Allergy] Other (See Comments)    unspecified  . Cefadroxil Rash  . Lisinopril Rash   Follow-up Information    Cathy Sams, DO. Schedule an appointment as soon as possible for a visit in 1 week(s).   Specialty:  Family Medicine Why:  Needs CBC checked at FU and restart BP medicine if BP trends up Contact information: [address] [place] [address] [phone]        [place] Follow up in 1 month(s).   Why:  Please FU Gastric ulcer Biopsy and H pylori           The results of significant diagnostics from this hospitalization (including imaging, microbiology, ancillary and laboratory) are listed below for reference.    Significant Diagnostic Studies: Nm Clark Blood Loss  Result Date: 11/30/2016 CLINICAL DATA:  56 year old female with Clark bleed. EXAM: NUCLEAR MEDICINE GASTROINTESTINAL BLEEDING SCAN TECHNIQUE: Sequential abdominal images were obtained following intravenous administration of Tc-88m labeled red blood cells. RADIOPHARMACEUTICALS:  26.8 mCi Tc-72m in-vitro labeled red cells. COMPARISON:  None. FINDINGS: There is physiologic uptake in the liver, cardiac blood pool, aorta, IVC, and iliac vessels. Excreted radiopharmaceutical noted within the bladder. No abnormal uptake noted conforming to the bowel to suggest active Clark bleed. IMPRESSION: No definite scintigraphic evidence of active Clark bleed. Electronically Signed   By: Cathy Clark M.D.   On: 11/30/2016 23:56    Microbiology: Recent Results (from the past 240 hour(s))  MRSA PCR Screening     Status: None   Collection Time: 11/30/16  2:54 PM  Result Value Ref Range Status   MRSA by PCR NEGATIVE NEGATIVE Final    Comment:        The GeneXpert MRSA Assay (FDA approved for NASAL specimens only), is one component of a comprehensive MRSA colonization surveillance program. It is not intended to diagnose MRSA infection nor to guide or monitor treatment for MRSA infections.       Labs: Basic Metabolic Panel:  Recent Labs Lab 11/28/16 0936 11/29/16 0642 11/29/16 2143 12/01/16 0824 12/02/16 0000  NA 140 135 135 139 138  K 3.5 3.5 3.3* 3.2* 3.5  CL 108 107 109 113* 111  CO2 24 23 23 23 24   GLUCOSE 143* 133* 125* 104* 101*  BUN 18 11 8 6  <5*  CREATININE 0.84 0.86 0.75 0.75 0.83  CALCIUM 8.8* 7.9* 7.3* 7.3* 8.0*  MG  --   --   --  1.5*  --   PHOS  --   --   --  2.6  --    Liver Function Tests:  Recent Labs Lab 11/28/16 0936  AST 36  ALT 22  ALKPHOS 61  BILITOT 0.4  PROT 6.6  ALBUMIN 3.2*   No results for input(s): LIPASE, AMYLASE in the last 168 hours. No results for input(s): AMMONIA in the last 168 hours. CBC:  Recent Labs Lab 11/30/16 1031  12/01/16 0429 12/01/16 0824 12/01/16 1545 12/02/16 0030 12/02/16 1433  WBC 17.2*  --   --  11.4* 12.2* 11.6* 10.4  HGB 6.7*  < > 7.6* 7.8* 7.8* 7.9* 6.7*  HCT  20.2*  < > 22.4* 22.6* 23.0* 23.7* 20.6*  MCV 85.6  --   --  85.9 86.1 87.5 89.6  PLT 102*  --   --  92* 115* 138* 160  < > = values in this interval not displayed. Cardiac Enzymes:  Recent Labs Lab 11/28/16 0936  TROPONINI <0.03   BNP: BNP (last 3 results) No results for input(s): BNP in the last 8760 hours.  ProBNP (last 3 results) No results for input(s): PROBNP in the last 8760 hours.  CBG:  Recent Labs Lab 11/30/16 2209 12/01/16 0003 12/01/16 0414 12/01/16 0738 12/02/16 1633  GLUCAP 102* 98 96 93 109*       Signed:  Nayab Aten MD.  [facility] 12/02/2016, 5:07 PM

## 2016-12-02 NOTE — Progress Notes (Signed)
Patient continues to have multiple bloody watery bowel movements. Internal Medicine, GI, and charge nurse aware. Patient is symptomatic with tachycardia, lethargy, and hypotension. Nuclear Medicine scan was ordered, as well as a type and screen, 2 RBCs and a saline bolus. The saline bolus was given and BP was responsive. Type and screen was taken and transport arrived shortly after for the nuclear scan. The plan was to start the scan and go get the blood after the type and screen processed.  Patient was refusing scan at this time because she was under the impression [facility] family members. I talked with both the patient and her father about the importance of this scan and how the transfer could take hours. They both agreed to the scan after that. We proceeded to the scan but then her husband arrived and said "absolutely not". The patient and I waited in Nuclear Med for the husband to "call [facility]" to get her transferred. During this time, the patient became more lethargic and had another large bloody bowel movement. The radiology tech and myself spoke to the husband and told him the importance of this scan and how [facility] would potentially do the same scan and he said he "does not care [facility], just give her blood until she can be transferred. Dr. Jomarie LongsJoseph called me to say [facility] but it was incomplete at this time. I called report to [facility] and updated them on the situation and transferred the patient so she could receive the blood. The patient had another large bright red bloody bowel movement on the way to the ICU.

## 2016-12-02 NOTE — Discharge Summary (Deleted)
Physician Discharge Summary  Cathy Clark ZOX:[id number] DOB: 03/24/1961 DOA: 11/28/2016  PCP: Tommie Samsook, Jayce G, DO  Admit date: 11/28/2016 Discharge date: 12/02/2016  Time spent: 35 minutes  Recommendations for Outpatient Follow-up:  1. PCP in 1 week, please check CBC at Follow up, start Iron after 1 week, please FU Gastric ulcer Biopsy and Hpylori 2. [facility]  In 1 month  Discharge Diagnoses:    Upper GI Bleed   Multiple gastric ulcers   Essential hypertension   GI bleed   Acute blood loss anemia     Discharge Condition: improved  Diet recommendation: heart healthy  Filed Weights   11/28/16 1600 12/01/16 0419 12/02/16 0300  Weight: 61.2 kg (135 lb) 66.7 kg (147 lb 0.8 oz) 63.1 kg (139 lb 1.6 oz)    History of present illness:   56 year old female with history of hypertension, occasional headaches uses Goody powders 2 times a week admitted with multiple episodes of bright red blood per rectum occasionally mixed with darker stools for 3-4 days Hemoglobin 7.8 outside hospital transfused 1 unit of PRBC and [facility]  Hospital Course:   1. Upper GI bleeding -admitted after multiple episodes of with bright and dark blood per rectum -EGD 6/16 with Non-bleeding gastric ulcers with a visible vessel. Treated with bipolar cautery. Clip (MR unsafe) was placed. -Colonoscopy 6/16 was unremarkable -then had 2 episodes of profuse bleeding again 6/17 am with hypotension, transferred to ICU, urgent EGD done by Dr.Hung, no active bleeding noted, additional hemoclips placed at the ulcer site -given 8units PRBC total between OSH and Here -bleeding has then subsided, no episodes for 48hours, change PPI to PO -started diet, Hb stable now -start Iron in 1 week, discharged on PPI, FU with GI-needs FU of gastric biopsy and Hpylori  2. Acute blood loss anemia -Secondary to above,  -s/p8 units PRBC this admission -Hb stable now 7.9 at discharge  3.  Essential  hypertension -BP soft but stable, stopped ARb -restart at FU if BP trends up high  4. . Mild thombocytopenia -acute, suspect related to consumption -improving -monitor at FU  Consultations:  [facility]  Discharge Exam: Vitals:   12/02/16 0300 12/02/16 0930  BP: 108/65 117/69  Pulse: 78   Resp: 17   Temp:      General: AAOx3 Cardiovascular: S1S2/RRR Respiratory: CTAB  Discharge Instructions   Discharge Instructions    Diet - low sodium heart healthy    Complete by:  As directed    Increase activity slowly    Complete by:  As directed      Current Discharge Medication List    START taking these medications   Details  ferrous sulfate (FERROUSUL) 325 (65 FE) MG tablet Take 1 tablet (325 mg total) by mouth 2 (two) times daily with a meal. Start after 1 week Qty: 30 tablet, Refills: 0    pantoprazole (PROTONIX) 40 MG tablet Take 1 tablet (40 mg total) by mouth 2 (two) times daily. Take twice a day for 1 month then once daily Qty: 60 tablet, Refills: 1      CONTINUE these medications which have NOT CHANGED   Details  EPINEPHrine 0.3 mg/0.3 mL IJ SOAJ injection Inject 0.3 mLs (0.3 mg total) into the muscle once. Qty: 1 Device, Refills: 0      STOP taking these medications     Aspirin-Acetaminophen-Caffeine (GOODY HEADACHE PO)      losartan (COZAAR) 50 MG tablet  Allergies  Allergen Reactions  . Shrimp [Shellfish Allergy] Other (See Comments)    unspecified  . Cefadroxil Rash  . Lisinopril Rash   Follow-up Information    Tommie Sams, DO. Schedule an appointment as soon as possible for a visit in 1 week(s).   Specialty:  Family Medicine Why:  Needs CBC checked at FU and restart BP medicine if BP trends up Contact information: [address] Laurell [address] [place] [address] [phone]        [facility] Follow up in 1 month(s).   Why:  Please FU Gastric ulcer Biopsy and H pylori           The results of significant diagnostics  from this hospitalization (including imaging, microbiology, ancillary and laboratory) are listed below for reference.    Significant Diagnostic Studies: Nm Gi Blood Loss  Result Date: 11/30/2016 CLINICAL DATA:  56 year old female with GI bleed. EXAM: NUCLEAR MEDICINE GASTROINTESTINAL BLEEDING SCAN TECHNIQUE: Sequential abdominal images were obtained following intravenous administration of Tc-6m labeled red blood cells. RADIOPHARMACEUTICALS:  26.8 mCi Tc-33m in-vitro labeled red cells. COMPARISON:  None. FINDINGS: There is physiologic uptake in the liver, cardiac blood pool, aorta, IVC, and iliac vessels. Excreted radiopharmaceutical noted within the bladder. No abnormal uptake noted conforming to the bowel to suggest active GI bleed. IMPRESSION: No definite scintigraphic evidence of active GI bleed. Electronically Signed   By: Elgie Collard M.D.   On: 11/30/2016 23:56    Microbiology: Recent Results (from the past 240 hour(s))  MRSA PCR Screening     Status: None   Collection Time: 11/30/16  2:54 PM  Result Value Ref Range Status   MRSA by PCR NEGATIVE NEGATIVE Final    Comment:        The GeneXpert MRSA Assay (FDA approved for NASAL specimens only), is one component of a comprehensive MRSA colonization surveillance program. It is not intended to diagnose MRSA infection nor to guide or monitor treatment for MRSA infections.      Labs: Basic Metabolic Panel:  Recent Labs Lab 11/28/16 0936 11/29/16 0642 11/29/16 2143 12/01/16 0824 12/02/16 0000  NA 140 135 135 139 138  K 3.5 3.5 3.3* 3.2* 3.5  CL 108 107 109 113* 111  CO2 24 23 23 23 24   GLUCOSE 143* 133* 125* 104* 101*  BUN 18 11 8 6  <5*  CREATININE 0.84 0.86 0.75 0.75 0.83  CALCIUM 8.8* 7.9* 7.3* 7.3* 8.0*  MG  --   --   --  1.5*  --   PHOS  --   --   --  2.6  --    Liver Function Tests:  Recent Labs Lab 11/28/16 0936  AST 36  ALT 22  ALKPHOS 61  BILITOT 0.4  PROT 6.6  ALBUMIN 3.2*   No results for  input(s): LIPASE, AMYLASE in the last 168 hours. No results for input(s): AMMONIA in the last 168 hours. CBC:  Recent Labs Lab 11/30/16 0753 11/30/16 1031 11/30/16 2244 12/01/16 0429 12/01/16 0824 12/01/16 1545 12/02/16 0030  WBC 11.8* 17.2*  --   --  11.4* 12.2* 11.6*  HGB 7.9* 6.7* 8.0* 7.6* 7.8* 7.8* 7.9*  HCT 23.9* 20.2* 23.6* 22.4* 22.6* 23.0* 23.7*  MCV 84.8 85.6  --   --  85.9 86.1 87.5  PLT 106* 102*  --   --  92* 115* 138*   Cardiac Enzymes:  Recent Labs Lab 11/28/16 0936  TROPONINI <0.03   BNP: BNP (last 3 results) No results  for input(s): BNP in the last 8760 hours.  ProBNP (last 3 results) No results for input(s): PROBNP in the last 8760 hours.  CBG:  Recent Labs Lab 11/30/16 1432 11/30/16 2209 12/01/16 0003 12/01/16 0414 12/01/16 0738  GLUCAP 99 102* 98 96 93       Signed:  Yoshito Gaza MD.  [facility] 12/02/2016, 12:03 PM

## 2016-12-02 NOTE — Progress Notes (Signed)
[facility], and [place] and patient discharged. Pt VSS on discharge.

## 2016-12-02 NOTE — Plan of Care (Signed)
Problem: Tissue Perfusion: Goal: Risk factors for ineffective tissue perfusion will decrease Outcome: Progressing VSS throughout shift. No evidence of GI bleed. Pt still very weak and unsteady. Orthostatics checked. Pt denies any pain. Continue to monitor.

## 2016-12-02 NOTE — Progress Notes (Addendum)
PROGRESS NOTE    Cathy Clark  ZOX:[id number] DOB: February 19, 1961 DOA: 11/28/2016 PCP: Tommie Sams, DO  Brief Narrative: 56 year old female with history of hypertension, occasional headaches uses Goody powders multiple times a week admitted with multiple episodes of bright red blood per rectum occasionally mixed with darker stools for 3-4 days Hemoglobin 7.8 outside hospital transfused 1 unit of PRBC and transferred to [facility], no bed was available at [facility] prior to transfer here. EGD and Colonoscopy-gastric ulcer s/p clipping, rebleeding -appears lower GI, see details below   Assessment & Plan:   1. Upper GI bleeding/unclear if she could be having a small intestinal bleed too -she had no bleeding for almost 48hours and then multiple episodes of bleeding this afternoon before planned discharge with drop in BP, mostly bright red blood per rectum -EGD 6/16 with Non-bleeding gastric ulcers with a visible vessel. Treated with bipolar cautery. Clip (MR unsafe) was placed. -Colonoscopy 6/16 was unremarkable -then had 2 episodes of profuse bleeding again 6/17 am with hypotension, transferred to ICU, urgent EGD done by Dr.Hung, no active bleeding noted, additional hemoclips placed at the ulcer site -given 8units PRBC total between OSH and Here -continue PPI -THIS AFTERNOON: given 3-4 episodes of bleeding in 1.5 hours, ordered 2 units PRBC, repeat Hb 6.7, Updated GI again, STAT Bleeding scan ordered -ordered Transfer to ICU and PCCM consulted -family very upset, requesting [facility] Transfer, I put a request at the transfer line due to husband's insistence, I think shes  unstable to transfer anywhere now, told husband about this  2. Acute blood loss anemia -Secondary to above,  -s/p 8 units PRBC this admission -transfer 2 more units PRBC now  3.  Essential hypertension -BP soft, holding ARB  4. Mild thombocytopenia -This is acute suspect related to consumption  DVT prophylaxis : SCDs    Code Status:  full code  Family Communication: daughter at bedside  Disposition Plan: Transfer to ICU  Consultants:   [facility]   Subjective: 3 episodes of bleeding already this afternoon, BP dropped to  Objective: Vitals:   12/02/16 1330 12/02/16 1400 12/02/16 1410 12/02/16 1420  BP: 118/85 120/81 (!) 85/55 105/73  Pulse:      Resp:      Temp:      TempSrc:      SpO2:      Weight:      Height:        Intake/Output Summary (Last 24 hours) at 12/02/16 1535 Last data filed at 12/02/16 0910  Gross per 24 hour  Intake             1230 ml  Output              850 ml  Net              380 ml   Filed Weights   11/28/16 1600 12/01/16 0419 12/02/16 0300  Weight: 61.2 kg (135 lb) 66.7 kg (147 lb 0.8 oz) 63.1 kg (139 lb 1.6 oz)    Examination:  Gen: Awake, Alert, Oriented X 3, tinly built female, no distress HEENT: PERRLA, Neck supple, no JVD Lungs: Good air movement bilaterally, CTAB CVS: RRR,No Gallops,Rubs or new Murmurs Abd: soft, Non tender, non distended, BS present Extremities: No Cyanosis, Clubbing or edema Skin: no new rashes    Data Reviewed:   CBC:  Recent Labs Lab 11/30/16 1031  12/01/16 0429 12/01/16 0824 12/01/16 1545 12/02/16 0030 12/02/16 1433  WBC 17.2*  --   --  11.4* 12.2* 11.6* 10.4  HGB 6.7*  < > 7.6* 7.8* 7.8* 7.9* 6.7*  HCT 20.2*  < > 22.4* 22.6* 23.0* 23.7* 20.6*  MCV 85.6  --   --  85.9 86.1 87.5 89.6  PLT 102*  --   --  92* 115* 138* 160  < > = values in this interval not displayed. Basic Metabolic Panel:  Recent Labs Lab 11/28/16 0936 11/29/16 0642 11/29/16 2143 12/01/16 0824 12/02/16 0000  NA 140 135 135 139 138  K 3.5 3.5 3.3* 3.2* 3.5  CL 108 107 109 113* 111  CO2 24 23 23 23 24   GLUCOSE 143* 133* 125* 104* 101*  BUN 18 11 8 6  <5*  CREATININE 0.84 0.86 0.75 0.75 0.83  CALCIUM 8.8* 7.9* 7.3* 7.3* 8.0*  MG  --   --   --  1.5*  --   PHOS  --   --   --  2.6  --    GFR: Estimated Creatinine Clearance: 69.7  mL/min (by C-G formula based on SCr of 0.83 mg/dL). Liver Function Tests:  Recent Labs Lab 11/28/16 0936  AST 36  ALT 22  ALKPHOS 61  BILITOT 0.4  PROT 6.6  ALBUMIN 3.2*   No results for input(s): LIPASE, AMYLASE in the last 168 hours. No results for input(s): AMMONIA in the last 168 hours. Coagulation Profile:  Recent Labs Lab 11/29/16 0642 12/01/16 0824  INR 1.29 1.21   Cardiac Enzymes:  Recent Labs Lab 11/28/16 0936  TROPONINI <0.03   BNP (last 3 results) No results for input(s): PROBNP in the last 8760 hours. HbA1C: No results for input(s): HGBA1C in the last 72 hours. CBG:  Recent Labs Lab 11/30/16 1432 11/30/16 2209 12/01/16 0003 12/01/16 0414 12/01/16 0738  GLUCAP 99 102* 98 96 93   Lipid Profile: No results for input(s): CHOL, HDL, LDLCALC, TRIG, CHOLHDL, LDLDIRECT in the last 72 hours. Thyroid Function Tests: No results for input(s): TSH, T4TOTAL, FREET4, T3FREE, THYROIDAB in the last 72 hours. Anemia Panel: No results for input(s): VITAMINB12, FOLATE, FERRITIN, TIBC, IRON, RETICCTPCT in the last 72 hours. Urine analysis: No results found for: COLORURINE, APPEARANCEUR, LABSPEC, PHURINE, GLUCOSEU, HGBUR, BILIRUBINUR, KETONESUR, PROTEINUR, UROBILINOGEN, NITRITE, LEUKOCYTESUR Sepsis Labs: @LABRCNTIP (procalcitonin:4,lacticidven:4)  ) Recent Results (from the past 240 hour(s))  MRSA PCR Screening     Status: None   Collection Time: 11/30/16  2:54 PM  Result Value Ref Range Status   MRSA by PCR NEGATIVE NEGATIVE Final    Comment:        The GeneXpert MRSA Assay (FDA approved for NASAL specimens only), is one component of a comprehensive MRSA colonization surveillance program. It is not intended to diagnose MRSA infection nor to guide or monitor treatment for MRSA infections.          Radiology Studies: Nm Gi Blood Loss  Result Date: 11/30/2016 CLINICAL DATA:  56 year old female with GI bleed. EXAM: NUCLEAR MEDICINE GASTROINTESTINAL  BLEEDING SCAN TECHNIQUE: Sequential abdominal images were obtained following intravenous administration of Tc-54m labeled red blood cells. RADIOPHARMACEUTICALS:  26.8 mCi Tc-54m in-vitro labeled red cells. COMPARISON:  None. FINDINGS: There is physiologic uptake in the liver, cardiac blood pool, aorta, IVC, and iliac vessels. Excreted radiopharmaceutical noted within the bladder. No abnormal uptake noted conforming to the bowel to suggest active GI bleed. IMPRESSION: No definite scintigraphic evidence of active GI bleed. Electronically Signed   By: Elgie Collard M.D.   On: 11/30/2016 23:56        Scheduled Meds: .  pantoprazole (PROTONIX) IV  40 mg Intravenous Q12H   Continuous Infusions: . sodium chloride    . sodium chloride       LOS: 4 days    Time spent: 35min    Zannie CovePreetha Kento Gossman, MD [facility] Pager [phone]  If 7PM-7AM, please contact night-coverage [email or web address] Password [email or web address] 12/02/2016, 3:35 PM

## 2016-12-03 ENCOUNTER — Encounter (HOSPITAL_COMMUNITY): Payer: Self-pay | Admitting: Gastroenterology

## 2016-12-03 DIAGNOSIS — T182XXA Foreign body in stomach, initial encounter: Secondary | ICD-10-CM | POA: Diagnosis not present

## 2016-12-03 DIAGNOSIS — K254 Chronic or unspecified gastric ulcer with hemorrhage: Secondary | ICD-10-CM | POA: Diagnosis not present

## 2016-12-03 DIAGNOSIS — D500 Iron deficiency anemia secondary to blood loss (chronic): Secondary | ICD-10-CM | POA: Diagnosis not present

## 2016-12-03 DIAGNOSIS — K259 Gastric ulcer, unspecified as acute or chronic, without hemorrhage or perforation: Secondary | ICD-10-CM | POA: Diagnosis not present

## 2016-12-03 DIAGNOSIS — K922 Gastrointestinal hemorrhage, unspecified: Secondary | ICD-10-CM | POA: Diagnosis not present

## 2016-12-03 DIAGNOSIS — K5730 Diverticulosis of large intestine without perforation or abscess without bleeding: Secondary | ICD-10-CM | POA: Diagnosis not present

## 2016-12-03 DIAGNOSIS — K921 Melena: Secondary | ICD-10-CM | POA: Diagnosis not present

## 2016-12-03 LAB — BPAM RBC
BLOOD PRODUCT EXPIRATION DATE: 201807172359
BLOOD PRODUCT EXPIRATION DATE: 201807172359
ISSUE DATE / TIME: 201806191610
ISSUE DATE / TIME: 201806191652
UNIT TYPE AND RH: 5100
UNIT TYPE AND RH: 5100

## 2016-12-03 LAB — TYPE AND SCREEN
ABO/RH(D): O POS
ANTIBODY SCREEN: NEGATIVE
Unit division: 0
Unit division: 0

## 2016-12-04 DIAGNOSIS — K5730 Diverticulosis of large intestine without perforation or abscess without bleeding: Secondary | ICD-10-CM | POA: Diagnosis not present

## 2016-12-04 DIAGNOSIS — D500 Iron deficiency anemia secondary to blood loss (chronic): Secondary | ICD-10-CM | POA: Diagnosis not present

## 2016-12-04 DIAGNOSIS — K922 Gastrointestinal hemorrhage, unspecified: Secondary | ICD-10-CM | POA: Diagnosis not present

## 2016-12-04 DIAGNOSIS — K921 Melena: Secondary | ICD-10-CM | POA: Diagnosis not present

## 2016-12-04 DIAGNOSIS — K259 Gastric ulcer, unspecified as acute or chronic, without hemorrhage or perforation: Secondary | ICD-10-CM | POA: Diagnosis not present

## 2016-12-04 DIAGNOSIS — K254 Chronic or unspecified gastric ulcer with hemorrhage: Secondary | ICD-10-CM | POA: Diagnosis not present

## 2016-12-05 DIAGNOSIS — K259 Gastric ulcer, unspecified as acute or chronic, without hemorrhage or perforation: Secondary | ICD-10-CM | POA: Diagnosis not present

## 2016-12-05 DIAGNOSIS — R262 Difficulty in walking, not elsewhere classified: Secondary | ICD-10-CM | POA: Diagnosis not present

## 2016-12-05 DIAGNOSIS — K921 Melena: Secondary | ICD-10-CM | POA: Diagnosis not present

## 2016-12-05 DIAGNOSIS — K254 Chronic or unspecified gastric ulcer with hemorrhage: Secondary | ICD-10-CM | POA: Diagnosis not present

## 2016-12-06 DIAGNOSIS — K264 Chronic or unspecified duodenal ulcer with hemorrhage: Secondary | ICD-10-CM | POA: Diagnosis not present

## 2016-12-06 DIAGNOSIS — R262 Difficulty in walking, not elsewhere classified: Secondary | ICD-10-CM | POA: Diagnosis not present

## 2016-12-06 DIAGNOSIS — K921 Melena: Secondary | ICD-10-CM | POA: Diagnosis not present

## 2016-12-11 ENCOUNTER — Ambulatory Visit (INDEPENDENT_AMBULATORY_CARE_PROVIDER_SITE_OTHER): Payer: BLUE CROSS/BLUE SHIELD | Admitting: Family Medicine

## 2016-12-11 VITALS — BP 118/80 | HR 50 | Temp 98.3°F | Wt 134.0 lb

## 2016-12-11 DIAGNOSIS — D649 Anemia, unspecified: Secondary | ICD-10-CM | POA: Diagnosis not present

## 2016-12-11 DIAGNOSIS — K921 Melena: Secondary | ICD-10-CM

## 2016-12-11 DIAGNOSIS — D62 Acute posthemorrhagic anemia: Secondary | ICD-10-CM

## 2016-12-11 LAB — CBC
HEMATOCRIT: 28.2 % — AB (ref 35.0–45.0)
Hemoglobin: 8.8 g/dL — ABNORMAL LOW (ref 11.7–15.5)
MCH: 29.5 pg (ref 27.0–33.0)
MCHC: 31.2 g/dL — AB (ref 32.0–36.0)
MCV: 94.6 fL (ref 80.0–100.0)
MPV: 9.3 fL (ref 7.5–12.5)
PLATELETS: 516 10*3/uL — AB (ref 140–400)
RBC: 2.98 MIL/uL — ABNORMAL LOW (ref 3.80–5.10)
RDW: 16 % — AB (ref 11.0–15.0)
WBC: 8.2 10*3/uL (ref 3.8–10.8)

## 2016-12-11 LAB — IRON,TIBC AND FERRITIN PANEL
%SAT: 7 % — ABNORMAL LOW (ref 11–50)
FERRITIN: 180 ng/mL (ref 10–232)
IRON: 18 ug/dL — AB (ref 45–160)
TIBC: 268 ug/dL (ref 250–450)

## 2016-12-11 MED ORDER — FERROUS SULFATE 325 (65 FE) MG PO TBEC: 325.0000 mg | DELAYED_RELEASE_TABLET | Freq: Three times a day (TID) | ORAL | 3 refills | Status: AC

## 2016-12-11 NOTE — Patient Instructions (Signed)
I will call with the results.  Continue the protonix.  No NSAID's!.  Follow up pending labs.  Take care  Dr. Adriana Simasook

## 2016-12-11 NOTE — Progress Notes (Signed)
Subjective:  Patient ID: Cathy Clark, female    DOB: 1960/07/08  Age: 55 y.o. MRN: [id number]  CC: Clark follow up  HPI:  56 year old female with a history of hypertension presents for Clark follow-up.  Hospitalization reviewed and summarized as follows:  Patient presented to the Cathy Clark on 6/15 with reports of dark stool and bright red blood per rectum. She also had had a syncopal episode. She was evaluated and found to have positive Hemoccult. Hemoglobin was 7.8. GI coverage was not available. She was transferred 1 unit of packed red blood cells and was transferred to Cathy Clark. EGD was obtained and revealed nonbleeding gastric ulcers with a visible vessel. This was treated with cautery and a clip. She had unremarkable colonoscopy. On 6/17 she had 2 episodes of profuse bleeding and hypotension. She was transferred to the ICU. Urgent endoscopy was done and revealed no active bleeding source. Additional clips were placed at the ulcer site. She received a total of 8 units of packed red blood cells within the first 2 days. On 6/19 she had additional bleeding episodes and a hemoglobin of 6.7. She was then transferred to Cathy Clark. Additional 2 units were given.  While at Cathy Clark, she received additional 4 units of packed red blood cells, 2 units of FFP, and 1 unit of prior. She was given bolus PPI. Further endoscopy and colonoscopies were negative for a bleeding source. She subsequent had no further bleeding episodes and her hemoglobin remained stable. At the time of discharge her hemoglobin was 7.5.  In regards to the etiology of her bleeding, it was thought that this was from NSAID use. She endorses using Goody powder 2-3 times a week.  Patient resents today for follow-up. She feels weak. She has had no further bleeding episodes. She endorses compliance with the Protonix. She is not taking and I supplemented this time. She is no longer taking her blood pressure medication either as this was  discontinued. Her blood pressure is stable/at goal today. Patient inquiring about a work note and also about additional iron therapy.  Social Hx   Social History   Social History  . Marital status: Legally Separated    Spouse name: N/A  . Number of children: N/A  . Years of education: N/A   Social History Main Topics  . Smoking status: Never Smoker  . Smokeless tobacco: Never Used  . Alcohol use No  . Drug use: No  . Sexual activity: Yes    Partners: Male   Other Topics Concern  . Not on file   Social History Narrative  . No narrative on file    Review of Systems  Constitutional: Positive for fatigue.  Gastrointestinal: Negative for abdominal pain, anal bleeding and blood in stool.  Neurological: Positive for weakness.   Objective:  BP 118/80 (BP Location: Left Arm, Patient Position: Sitting, Cuff Size: Normal)   Pulse (!) 50   Temp 98.3 F (36.8 C) (Oral)   Wt 134 lb (60.8 kg)   LMP 12/13/2014   SpO2 100%   BMI 22.30 kg/m   BP/Weight 12/11/2016 12/02/2016 11/28/2016  Systolic BP 118 101 -  Diastolic BP 80 57 -  Wt. (Lbs) 134 139 -  BMI 22.3 - 23.13    Physical Exam  Constitutional: She is oriented to person, place, and time. She appears well-developed. No distress.  Appears fatigued.  Cardiovascular: Normal rate and regular rhythm.   2/6 systolic murmur.  Pulmonary/Chest: Effort normal. She has no wheezes. She has  no rales.  Abdominal: Soft. She exhibits no distension. There is no tenderness.  Neurological: She is alert and oriented to person, place, and time.  Psychiatric: She has a normal mood and affect.  Vitals reviewed.   Lab Results  Component Value Date   WBC 9.3 12/02/2016   HGB 7.7 (L) 12/02/2016   HCT 22.8 (L) 12/02/2016   PLT 107 (L) 12/02/2016   GLUCOSE 101 (H) 12/02/2016   CHOL 172 09/16/2016   TRIG 57.0 09/16/2016   HDL 73.50 09/16/2016   LDLCALC 87 09/16/2016   ALT 22 11/28/2016   AST 36 11/28/2016   NA 138 12/02/2016   K 3.5  12/02/2016   CL 111 12/02/2016   CREATININE 0.83 12/02/2016   BUN <5 (L) 12/02/2016   CO2 24 12/02/2016   INR 1.21 12/01/2016   HGBA1C 6.1 09/16/2016    Assessment & Plan:   Problem List Items Addressed This Visit      Other   Acute blood loss anemia - Primary    Secondary to GI bleed. Rechecking hemoglobin and iron stores today. Restarting iron.      Relevant Medications   ferrous sulfate 325 (65 FE) MG EC tablet   Other Relevant Orders   Iron, TIBC and Ferritin Panel   CBC   Hematochezia    New problem (to me). Now resolved. No further bleeding. CBC and Iron studies today.         Meds ordered this encounter  Medications  . pantoprazole (PROTONIX) 40 MG tablet    Sig: Take 40 mg by mouth daily.  . ferrous sulfate 325 (65 FE) MG EC tablet    Sig: Take 1 tablet (325 mg total) by mouth 3 (three) times daily with meals.    Dispense:  90 tablet    Refill:  3   Follow-up: Pending labs.  Cathy Clark OtherJayce Ellene Bloodsaw DO [facility] [place]

## 2016-12-11 NOTE — Assessment & Plan Note (Signed)
Secondary to GI bleed. Rechecking hemoglobin and iron stores today. Restarting iron.

## 2016-12-11 NOTE — Assessment & Plan Note (Signed)
New problem (to me). Now resolved. No further bleeding. CBC and Iron studies today.

## 2016-12-15 ENCOUNTER — Telehealth: Payer: Self-pay | Admitting: Family Medicine

## 2016-12-15 NOTE — Telephone Encounter (Signed)
Pt called about having a question regarding medication ferrous sulfate 325 (65 FE) MG EC tablet. Please advise?  Call pt @ [phone]. Thank you!

## 2016-12-16 NOTE — Telephone Encounter (Signed)
Patient wants to clarify she is to take iron three time daily .  I informed her script states three time daily.  She though it was to be 1 daily.

## 2016-12-24 ENCOUNTER — Other Ambulatory Visit (INDEPENDENT_AMBULATORY_CARE_PROVIDER_SITE_OTHER): Payer: BLUE CROSS/BLUE SHIELD

## 2016-12-24 ENCOUNTER — Telehealth: Payer: Self-pay | Admitting: Family Medicine

## 2016-12-24 ENCOUNTER — Encounter: Payer: Self-pay | Admitting: Nurse Practitioner

## 2016-12-24 ENCOUNTER — Ambulatory Visit (INDEPENDENT_AMBULATORY_CARE_PROVIDER_SITE_OTHER): Payer: BLUE CROSS/BLUE SHIELD | Admitting: Nurse Practitioner

## 2016-12-24 VITALS — BP 130/86 | HR 72 | Ht 65.0 in | Wt 141.0 lb

## 2016-12-24 DIAGNOSIS — K922 Gastrointestinal hemorrhage, unspecified: Secondary | ICD-10-CM | POA: Diagnosis not present

## 2016-12-24 DIAGNOSIS — K259 Gastric ulcer, unspecified as acute or chronic, without hemorrhage or perforation: Secondary | ICD-10-CM | POA: Diagnosis not present

## 2016-12-24 LAB — CBC
HEMATOCRIT: 29.5 % — AB (ref 36.0–46.0)
HEMOGLOBIN: 9.8 g/dL — AB (ref 12.0–15.0)
MCHC: 33.1 g/dL (ref 30.0–36.0)
MCV: 93.8 fl (ref 78.0–100.0)
Platelets: 377 10*3/uL (ref 150.0–400.0)
RBC: 3.15 Mil/uL — ABNORMAL LOW (ref 3.87–5.11)
RDW: 18.8 % — AB (ref 11.5–15.5)
WBC: 6.8 10*3/uL (ref 4.0–10.5)

## 2016-12-24 MED ORDER — PANTOPRAZOLE SODIUM 40 MG PO TBEC: 40.0000 mg | DELAYED_RELEASE_TABLET | Freq: Every day | ORAL | 0 refills | Status: DC

## 2016-12-24 NOTE — Telephone Encounter (Signed)
Pt dropped off FMLA paperwork to filled out. palced in Dr. Pamala Duffelooks Color folder upfront. Please advise pt would like to pick the forms up

## 2016-12-24 NOTE — Progress Notes (Addendum)
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 She had an EGD and colonoscopy by Dr. Elnoria HowardHung who was covering for our practice over the weekend. EGD showed two nonbleeding gastric ulcers with a visible vessel treated with cautery and Hemoclip. Her colonoscopy was normal. The following day patient had recurrent hematochezia with hypotension. She was transferred to ICU. Repeat EGD by Dr. Elnoria HowardHung showed no blood in the stomach. In the antrum the previous ulcers with clips were seen but there was no overt bleeding at the sites. Because of the severity of the recurrent hematochezia he opted to close the ulcers with additional hemoclips. Two days later patient had recurrent hematochezia and hypotension.  Patient and family wanted to transfer to a different facility. We were concerned about her being transferred when hemodynamically unstable.. She agreed to a third EGD to be done by Dr. Russella DarStark. .Patient and family wanted transfer to a different facility. There was concern about her leaving the hospital in unstable condition. She agreed to repeat upper endoscopy by Dr. Russella DarStark. The 2 nonbleeding gastric ulcers with clips were seen in the antrum. One ulcer had a nonbleeding visible vessel at the edge of the ulcer and at a site separate from the 2 previously placed clips. Twoclips were successfully placed around the visible vessel. One of the clips placed at prior EGD became dislodged passing the scope into the duodenum. The side of the dislodged clip did not show stigmata of recent bleeding however.  A small protuberant lesion with mild oozing of blood was seen in the gastric fundus. 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  Once stabilized Cathy Clark [facility]. She underwent repeat EGD without any new findings. She did not rebleed.  She is taking iron and PPI. No further NSAIDs  Her hgb is  was up to 8.8 on 6/28. Gastric biopsies revealed active gastritis, no H.pylori.   Past Medical History:  Diagnosis Date  . Anemia 11/28/2016  . Complication of anesthesia 1997   "took me a long time to wake up when I had my daughter"  . History of blood transfusion 11/28/2016   "HgB low"  . Hypertension     Patient's surgical history, family medical history, social history, medications and allergies were all reviewed in Epic    Physical Exam: BP 130/86   Pulse 72   Ht 5\' 5"  (1.651 m)   Wt 141 lb (64 kg)   LMP 12/21/2016 (Approximate)   BMI 23.46 kg/m   GENERAL: well developed black female in NAD PSYCH: :Pleasant, cooperative, normal affect EENT:  conjunctiva pink, mucous membranes moist, neck supple without masses CARDIAC:  RRR, no murmur heard, no peripheral edema PULM: Normal respiratory effort, lungs CTA bilaterally, no wheezing ABDOMEN:  soft, nontender, nondistended, no obvious masses, no hepatomegaly,  normal bowel sounds SKIN:  turgor, no lesions seen Musculoskeletal:  Normal muscle tone, normal strength NEURO: Alert and oriented x 3, no focal neurologic deficits   ASSESSMENT and PLAN:   Pleasant 56 year old female with recent major upper GI bleed secondary to gastric ulcers with visible vessel in setting of NSAIDs. She required three EGDs as well as a colonoscopy (normal) for recurrent bleeding with hypotension. At time of her third EGD a suspected Dieulafoy lesion was found and treated with hemoclips.She stablized but [facility]. No new findings on EGD  at [facility] and she did not rebleed -no further bleeding. Hgb improving on iron, will recheck CBC today.  -continue PPI, refill given. Advised to take for additional month -Avoid all NSAIDS -I will call her with CBC results and recommendations regarding her iron supplements.    Cathy Clark , NP 12/24/2016, 1:46 PM  Agree with Ms. Cathy Clark assessment and plan. Cathy Boop, MD, Cathy Clark

## 2016-12-24 NOTE — Patient Instructions (Addendum)
If you are age 54 or older, your body mass index should be between 23-30. Your Body mass index is 23.46 kg/m. If this is out of the aforementioned range listed, please consider follow up with your Primary Care Provider.  If you are age 54 or younger, your body mass index should be between 19-25. Your Body mass index is 23.46 kg/m. If this is out of the aformentioned range listed, please consider follow up with your Primary Care Provider.  Your physician has requested that you go to the basement for the following lab work before leaving today: CBC Will call with results.  We have sent the following medications to your pharmacy for you to pick up at your convenience: Protonix Refill   - STOP  After completion of prescription.  Follow up as needed.  Thank you for choosing me and [facility].   Willette ClusterPaula Guenther, NP

## 2016-12-25 ENCOUNTER — Telehealth: Payer: Self-pay | Admitting: Nurse Practitioner

## 2016-12-25 NOTE — Telephone Encounter (Signed)
Values from recent CBC given to the patient. She is aware the results are not yet reviewed by her provider.

## 2016-12-25 NOTE — Telephone Encounter (Signed)
Paperwork placed in blue folder

## 2016-12-25 NOTE — Telephone Encounter (Signed)
Correction, form placed in red folder.

## 2016-12-26 NOTE — Telephone Encounter (Signed)
Done. Given to CMA 

## 2016-12-26 NOTE — Telephone Encounter (Signed)
Tried calling patient voicemail not setup  

## 2016-12-26 NOTE — Telephone Encounter (Signed)
Tried calling- no voicemail setup.

## 2016-12-29 ENCOUNTER — Other Ambulatory Visit: Payer: Self-pay

## 2016-12-29 DIAGNOSIS — D649 Anemia, unspecified: Secondary | ICD-10-CM

## 2016-12-29 NOTE — Telephone Encounter (Signed)
Tried calling not able to leave voicemail.

## 2016-12-30 NOTE — Telephone Encounter (Signed)
Patient came by office and picked up form.

## 2017-01-23 ENCOUNTER — Encounter: Payer: Self-pay | Admitting: Internal Medicine

## 2017-02-03 DIAGNOSIS — K250 Acute gastric ulcer with hemorrhage: Secondary | ICD-10-CM | POA: Diagnosis not present

## 2017-02-03 DIAGNOSIS — D62 Acute posthemorrhagic anemia: Secondary | ICD-10-CM | POA: Diagnosis not present

## 2017-02-11 DIAGNOSIS — K254 Chronic or unspecified gastric ulcer with hemorrhage: Secondary | ICD-10-CM | POA: Diagnosis not present

## 2017-02-11 DIAGNOSIS — Z8711 Personal history of peptic ulcer disease: Secondary | ICD-10-CM | POA: Diagnosis not present

## 2017-02-11 DIAGNOSIS — I10 Essential (primary) hypertension: Secondary | ICD-10-CM | POA: Diagnosis not present

## 2017-02-11 DIAGNOSIS — K259 Gastric ulcer, unspecified as acute or chronic, without hemorrhage or perforation: Secondary | ICD-10-CM | POA: Diagnosis not present

## 2017-02-11 DIAGNOSIS — K2950 Unspecified chronic gastritis without bleeding: Secondary | ICD-10-CM | POA: Diagnosis not present

## 2017-02-11 DIAGNOSIS — K289 Gastrojejunal ulcer, unspecified as acute or chronic, without hemorrhage or perforation: Secondary | ICD-10-CM | POA: Diagnosis not present

## 2017-02-11 DIAGNOSIS — T182XXA Foreign body in stomach, initial encounter: Secondary | ICD-10-CM | POA: Diagnosis not present

## 2017-02-11 DIAGNOSIS — K257 Chronic gastric ulcer without hemorrhage or perforation: Secondary | ICD-10-CM | POA: Diagnosis not present

## 2017-02-12 ENCOUNTER — Telehealth: Payer: Self-pay | Admitting: Family Medicine

## 2017-02-12 NOTE — Telephone Encounter (Signed)
Pt called and stated that she went to the GI yesterday and her bp was 157/107. Please advise, thank you!  Call pt @ [phone]

## 2017-02-12 NOTE — Telephone Encounter (Signed)
Advised patient to go to urgent care to be evaluated because the first available appointment is the middle of next week. Patient agreed.

## 2017-02-15 DIAGNOSIS — I10 Essential (primary) hypertension: Secondary | ICD-10-CM | POA: Diagnosis not present

## 2017-02-15 DIAGNOSIS — R079 Chest pain, unspecified: Secondary | ICD-10-CM | POA: Diagnosis not present

## 2017-02-19 ENCOUNTER — Ambulatory Visit (INDEPENDENT_AMBULATORY_CARE_PROVIDER_SITE_OTHER): Payer: BLUE CROSS/BLUE SHIELD | Admitting: Family Medicine

## 2017-02-19 ENCOUNTER — Encounter: Payer: Self-pay | Admitting: Family Medicine

## 2017-02-19 DIAGNOSIS — D62 Acute posthemorrhagic anemia: Secondary | ICD-10-CM

## 2017-02-19 DIAGNOSIS — I10 Essential (primary) hypertension: Secondary | ICD-10-CM

## 2017-02-19 NOTE — Assessment & Plan Note (Signed)
Now resolved. Doing well. Okay to return to work. Note given.

## 2017-02-19 NOTE — Assessment & Plan Note (Signed)
Advised to continue losartan. Recheck in 1 week (nurse visit). Needs BMP at that time.

## 2017-02-19 NOTE — Patient Instructions (Signed)
Losartan daily.  Nurse visit in 1 week.  Take care  Dr. Adriana Simasook

## 2017-02-19 NOTE — Progress Notes (Signed)
Subjective:  Patient ID: Cathy Clark, female    DOB: September 11, 1960  Age: 56 y.o. MRN: [id number]  CC: Follow up, Elevated BP, Wants to return to work.   HPI:  56 year old female with history of GI bleed due to gastric ulcer (presumed from NSAID use), Hypertension presents for follow-up.  Hypertension  Her BP has been elevated as of late. Her blood pressure medication was stopped during and after her hospitalization due to severe blood loss.  She has recently restarted her home blood pressure medication. BP elevated today. We discussed today.  Anemia/Gastric ulcer  Now resolved.  Patient has follow-up with GI. She is now off Protonix and is doing well.  She feels well and is here to return to work.  Social Hx   Social History   Social History  . Marital status: Legally Separated    Spouse name: N/A  . Number of children: N/A  . Years of education: N/A   Social History Main Topics  . Smoking status: Never Smoker  . Smokeless tobacco: Never Used  . Alcohol use No  . Drug use: No  . Sexual activity: Yes    Partners: Male   Other Topics Concern  . None   Social History Narrative  . None    Review of Systems  Constitutional: Negative.   Gastrointestinal: Negative.    Objective:  BP (!) 150/90 (BP Location: Left Arm, Patient Position: Sitting, Cuff Size: Normal)   Pulse 63   Temp 98.5 F (36.9 C) (Oral)   Wt 137 lb (62.1 kg)   LMP 12/21/2016 (Approximate)   SpO2 97%   BMI 22.80 kg/m   BP/Weight 02/19/2017 12/24/2016 12/11/2016  Systolic BP 150 130 118  Diastolic BP 90 86 80  Wt. (Lbs) 137 141 134  BMI 22.8 23.46 22.3    Physical Exam  Constitutional: She is oriented to person, place, and time. She appears well-developed. No distress.  Cardiovascular: Normal rate and regular rhythm.   No murmur heard. Pulmonary/Chest: Effort normal. She has no wheezes. She has no rales.  Neurological: She is alert and oriented to person, place, and time.    Psychiatric: She has a normal mood and affect.  Vitals reviewed.   Lab Results  Component Value Date   WBC 6.8 12/24/2016   HGB 9.8 (L) 12/24/2016   HCT 29.5 (L) 12/24/2016   PLT 377.0 12/24/2016   GLUCOSE 101 (H) 12/02/2016   CHOL 172 09/16/2016   TRIG 57.0 09/16/2016   HDL 73.50 09/16/2016   LDLCALC 87 09/16/2016   ALT 22 11/28/2016   AST 36 11/28/2016   NA 138 12/02/2016   K 3.5 12/02/2016   CL 111 12/02/2016   CREATININE 0.83 12/02/2016   BUN <5 (L) 12/02/2016   CO2 24 12/02/2016   INR 1.21 12/01/2016   HGBA1C 6.1 09/16/2016    Assessment & Plan:   Problem List Items Addressed This Visit    Essential hypertension    Advised to continue losartan. Recheck in 1 week (nurse visit). Needs BMP at that time.      Relevant Medications   losartan (COZAAR) 50 MG tablet   Acute blood loss anemia    Now resolved. Doing well. Okay to return to work. Note given.          Meds ordered this encounter  Medications  . losartan (COZAAR) 50 MG tablet    Sig: Take 50 mg by mouth daily.   Follow-up: Return in about 1 week (  around 02/26/2017) for BP check - Nurse visit.  Everlene OtherJayce Judia Arnott DO [facility] [place]

## 2017-02-25 ENCOUNTER — Other Ambulatory Visit: Payer: Self-pay | Admitting: Family Medicine

## 2017-02-25 ENCOUNTER — Telehealth: Payer: Self-pay | Admitting: Radiology

## 2017-02-25 DIAGNOSIS — I10 Essential (primary) hypertension: Secondary | ICD-10-CM

## 2017-02-25 NOTE — Telephone Encounter (Signed)
Pt coming in for labs tomorrow, please place future orders. Thank you.  

## 2017-02-26 ENCOUNTER — Ambulatory Visit (INDEPENDENT_AMBULATORY_CARE_PROVIDER_SITE_OTHER): Payer: BLUE CROSS/BLUE SHIELD | Admitting: *Deleted

## 2017-02-26 ENCOUNTER — Other Ambulatory Visit (INDEPENDENT_AMBULATORY_CARE_PROVIDER_SITE_OTHER): Payer: BLUE CROSS/BLUE SHIELD

## 2017-02-26 VITALS — BP 138/88 | HR 66 | Resp 16

## 2017-02-26 DIAGNOSIS — I10 Essential (primary) hypertension: Secondary | ICD-10-CM

## 2017-02-26 LAB — BASIC METABOLIC PANEL
BUN: 8 mg/dL (ref 6–23)
CALCIUM: 10 mg/dL (ref 8.4–10.5)
CO2: 30 mEq/L (ref 19–32)
CREATININE: 0.79 mg/dL (ref 0.40–1.20)
Chloride: 103 mEq/L (ref 96–112)
GFR: 97.43 mL/min (ref >60.00)
Glucose, Bld: 79 mg/dL (ref 70–99)
Potassium: 3.5 mEq/L (ref 3.5–5.1)
SODIUM: 138 meq/L (ref 135–145)

## 2017-02-26 NOTE — Progress Notes (Signed)
Patient presented for one week follow up on BP, left arm 138/80, pulse 66 right arm BP 138/88, pulse 66. Patient taking losartan 50 mg daily .

## 2017-03-02 NOTE — Progress Notes (Signed)
BP stable. Continue medication.  Everlene Other DO [facility]

## 2017-03-16 ENCOUNTER — Other Ambulatory Visit: Payer: Self-pay | Admitting: Obstetrics and Gynecology

## 2017-03-16 DIAGNOSIS — Z1239 Encounter for other screening for malignant neoplasm of breast: Secondary | ICD-10-CM

## 2017-12-26 ENCOUNTER — Other Ambulatory Visit: Payer: Self-pay | Admitting: Family Medicine

## 2017-12-26 IMAGING — MG MM DIGITAL SCREENING BILAT W/ CAD
4 series · 4 of 4 positions shown · non-contrast
Comparison: Previous exam(s).

CLINICAL DATA: Screening.

EXAM:
DIGITAL SCREENING BILATERAL MAMMOGRAM WITH CAD

[R MLO]
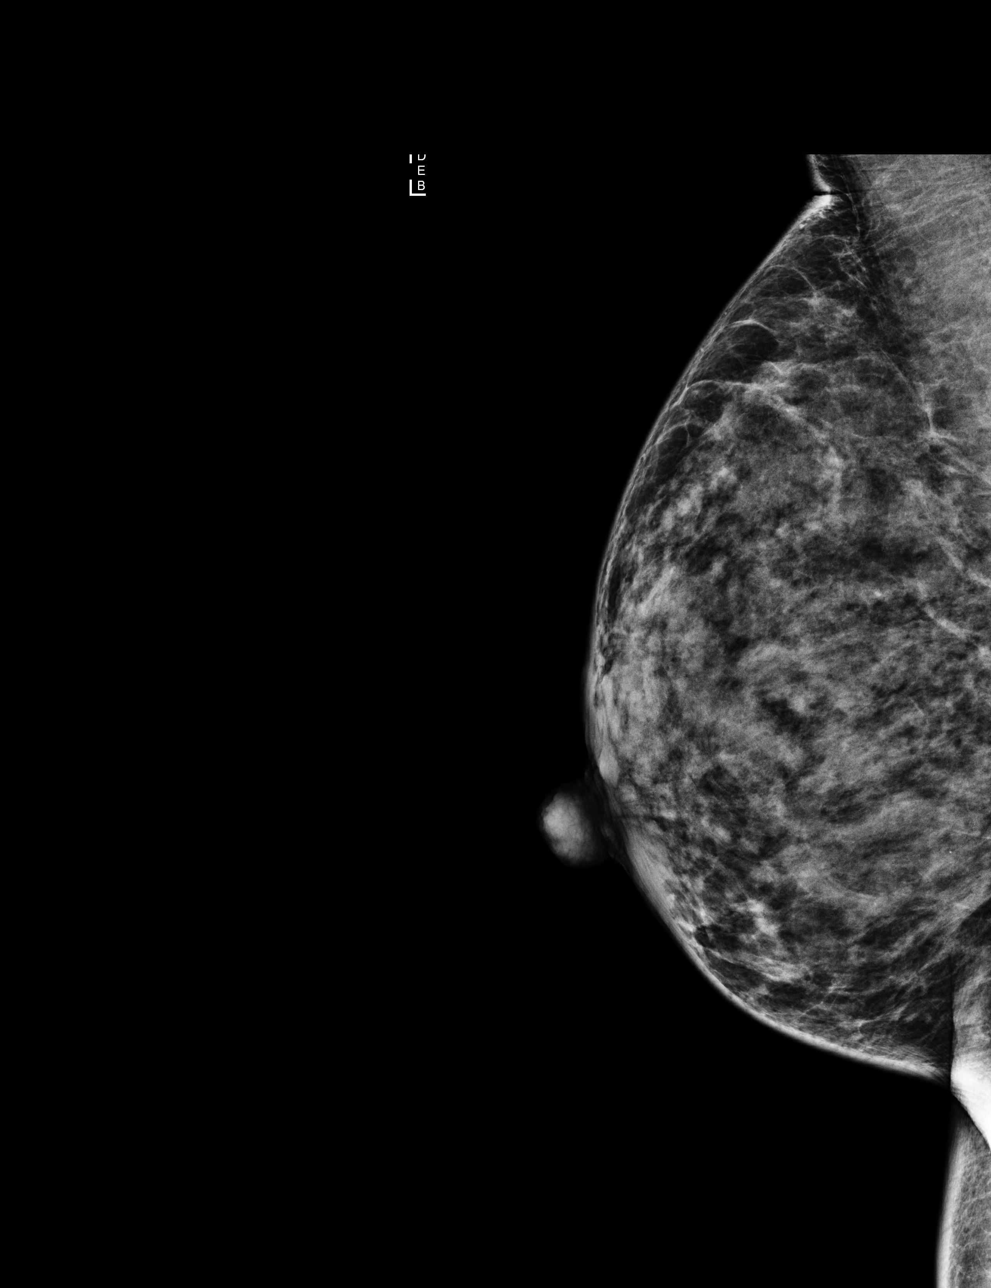

[L MLO]
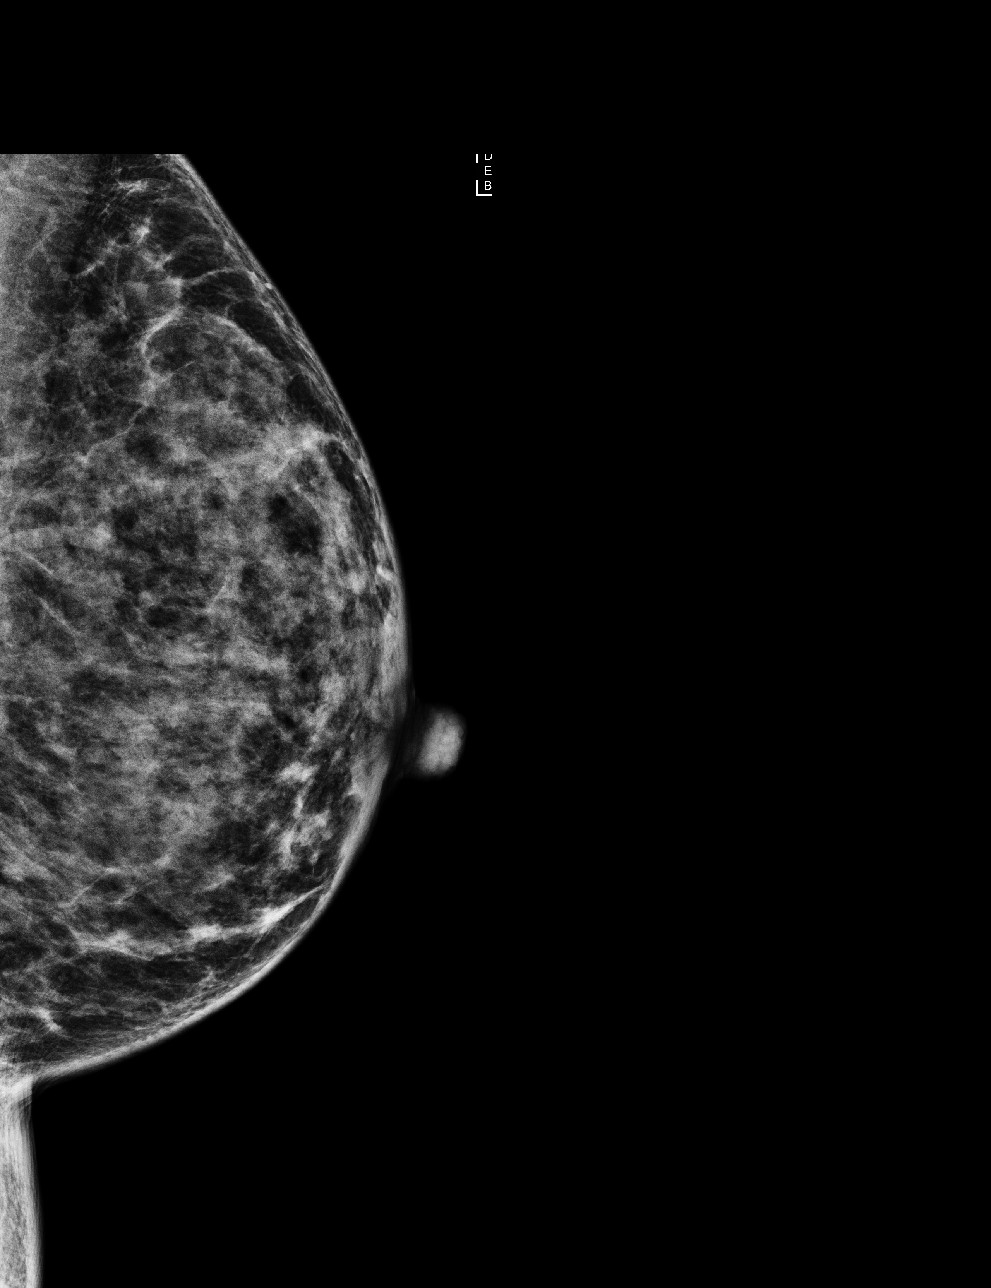

[R CC]
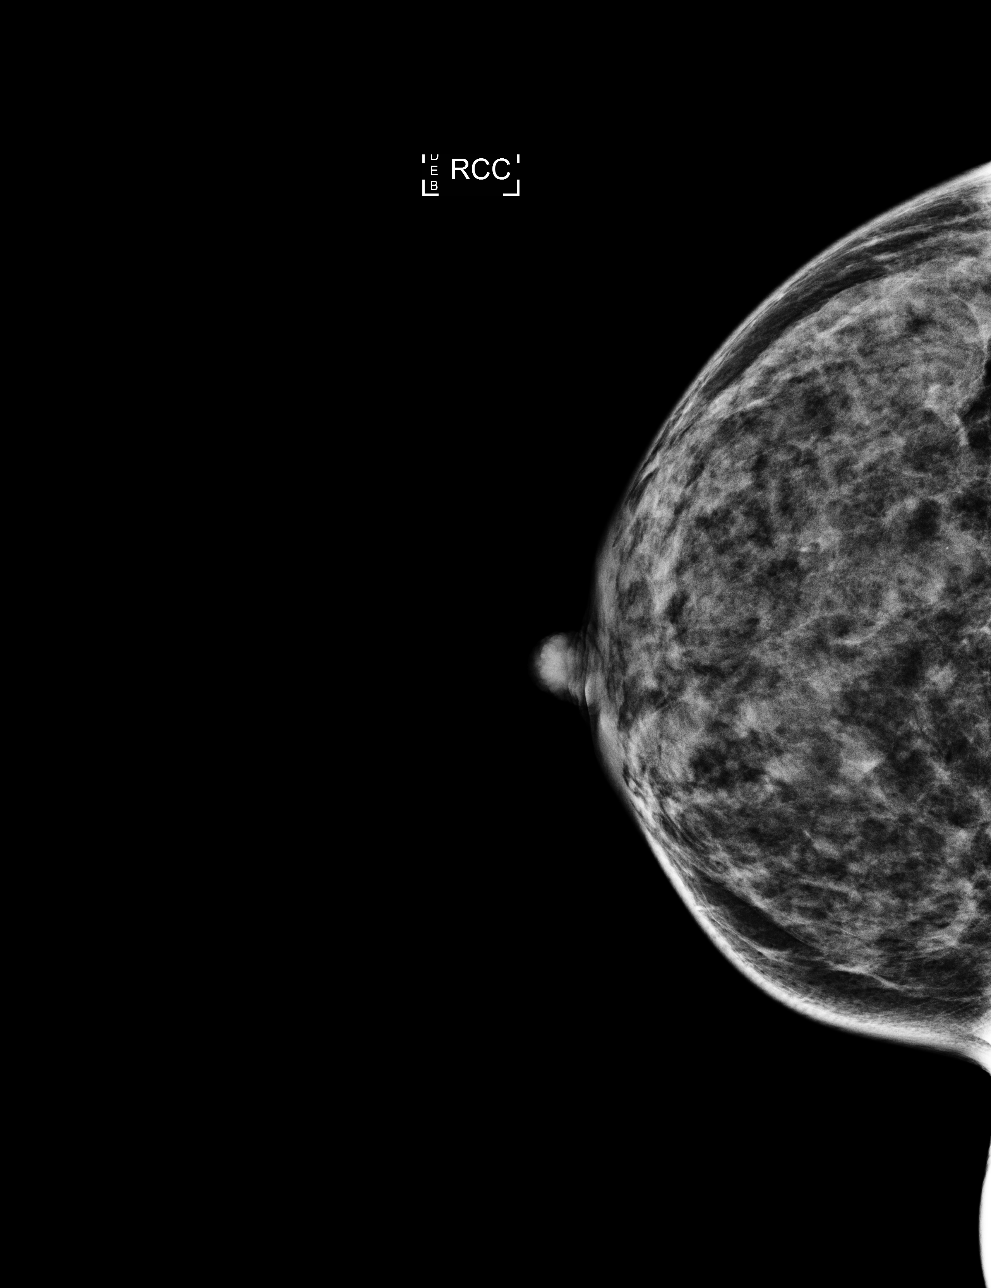

[L CC]
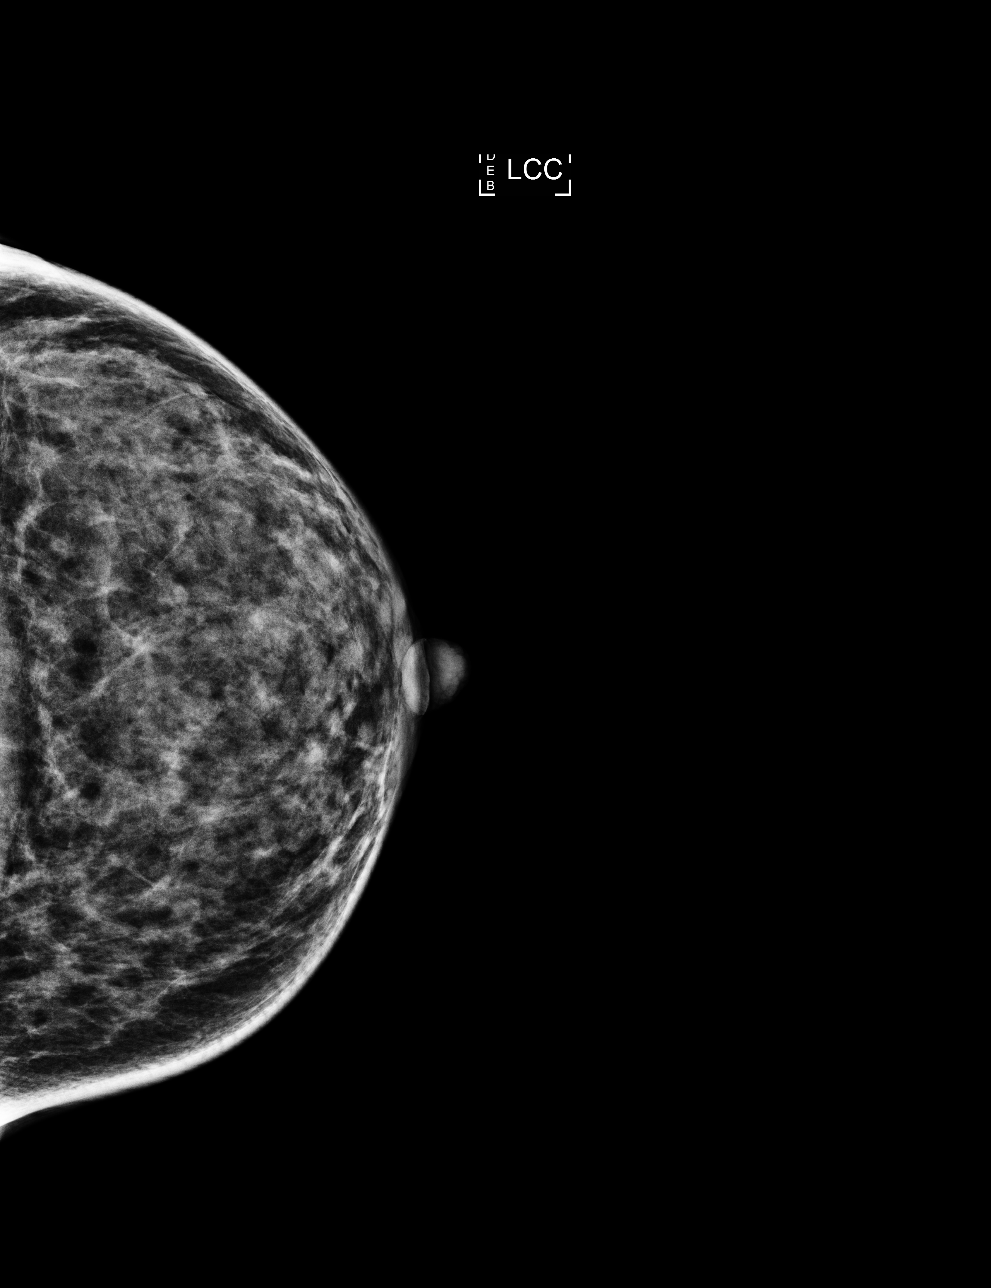

[4 of 4 positions shown; findings below may reference images not displayed]

ACR Breast Density Category d: The breast tissue is extremely dense,
which lowers the sensitivity of mammography.
FINDINGS: There are no findings suspicious for malignancy. Images were
processed with CAD.
IMPRESSION: No mammographic evidence of malignancy. A result letter of this
screening mammogram will be mailed directly to the patient.

RECOMMENDATION:
Screening mammogram in one year. (Code:[id number])

BI-RADS CATEGORY  1: Negative.

## 2017-12-28 ENCOUNTER — Other Ambulatory Visit: Payer: Self-pay | Admitting: Family Medicine

## 2017-12-28 NOTE — Telephone Encounter (Signed)
Pt calling for a refill of her losartan (COZAAR) 50 MG tablet [facility] [address] - [place], [place] - [id number] [phone] (Phone) [phone] (Fax)

## 2017-12-29 NOTE — Telephone Encounter (Signed)
Left message, for patient to call office needs to establish with new provider, prescribing provider no longer at this office.

## 2017-12-29 NOTE — Telephone Encounter (Signed)
Patient has an appointment to establish with [facility] on 7/ can I give patient a two week supply to last until appointment of losartan Dr. Adriana Simasook had  Called in filled for patient for one year, need at least 15 day supply til appointment.

## 2017-12-29 NOTE — Telephone Encounter (Signed)
Pt called to f/up on this request.   °

## 2017-12-30 DIAGNOSIS — I10 Essential (primary) hypertension: Secondary | ICD-10-CM | POA: Diagnosis not present

## 2017-12-30 NOTE — Telephone Encounter (Signed)
Pt called to check status of medication refill pt notified that message was routed to a physician

## 2017-12-30 NOTE — Telephone Encounter (Signed)
15-day supply sent in.  She needs to keep her appointment.

## 2018-01-13 ENCOUNTER — Ambulatory Visit: Payer: BLUE CROSS/BLUE SHIELD | Admitting: Family

## 2018-01-13 DIAGNOSIS — Z0289 Encounter for other administrative examinations: Secondary | ICD-10-CM

## 2018-01-18 DIAGNOSIS — R252 Cramp and spasm: Secondary | ICD-10-CM | POA: Diagnosis not present

## 2018-01-18 DIAGNOSIS — Z1231 Encounter for screening mammogram for malignant neoplasm of breast: Secondary | ICD-10-CM | POA: Diagnosis not present

## 2018-01-18 DIAGNOSIS — I10 Essential (primary) hypertension: Secondary | ICD-10-CM | POA: Diagnosis not present

## 2018-02-24 ENCOUNTER — Other Ambulatory Visit: Payer: Self-pay | Admitting: Family Medicine

## 2018-02-24 DIAGNOSIS — Z1231 Encounter for screening mammogram for malignant neoplasm of breast: Secondary | ICD-10-CM

## 2018-03-02 ENCOUNTER — Ambulatory Visit
Admission: RE | Admit: 2018-03-02 | Discharge: 2018-03-02 | Disposition: A | Discharge status: Home / Self Care | Payer: BLUE CROSS/BLUE SHIELD | Source: Ambulatory Visit | Attending: Family Medicine | Admitting: Family Medicine

## 2018-03-02 DIAGNOSIS — Z1231 Encounter for screening mammogram for malignant neoplasm of breast: Secondary | ICD-10-CM | POA: Insufficient documentation

## 2018-09-02 IMAGING — DX DG CHEST 1V PORT
1 series · 1 of 1 positions shown · non-contrast
Comparison: None.

CLINICAL DATA: Central line placement.

EXAM:
PORTABLE CHEST 1 VIEW

[chest ap]
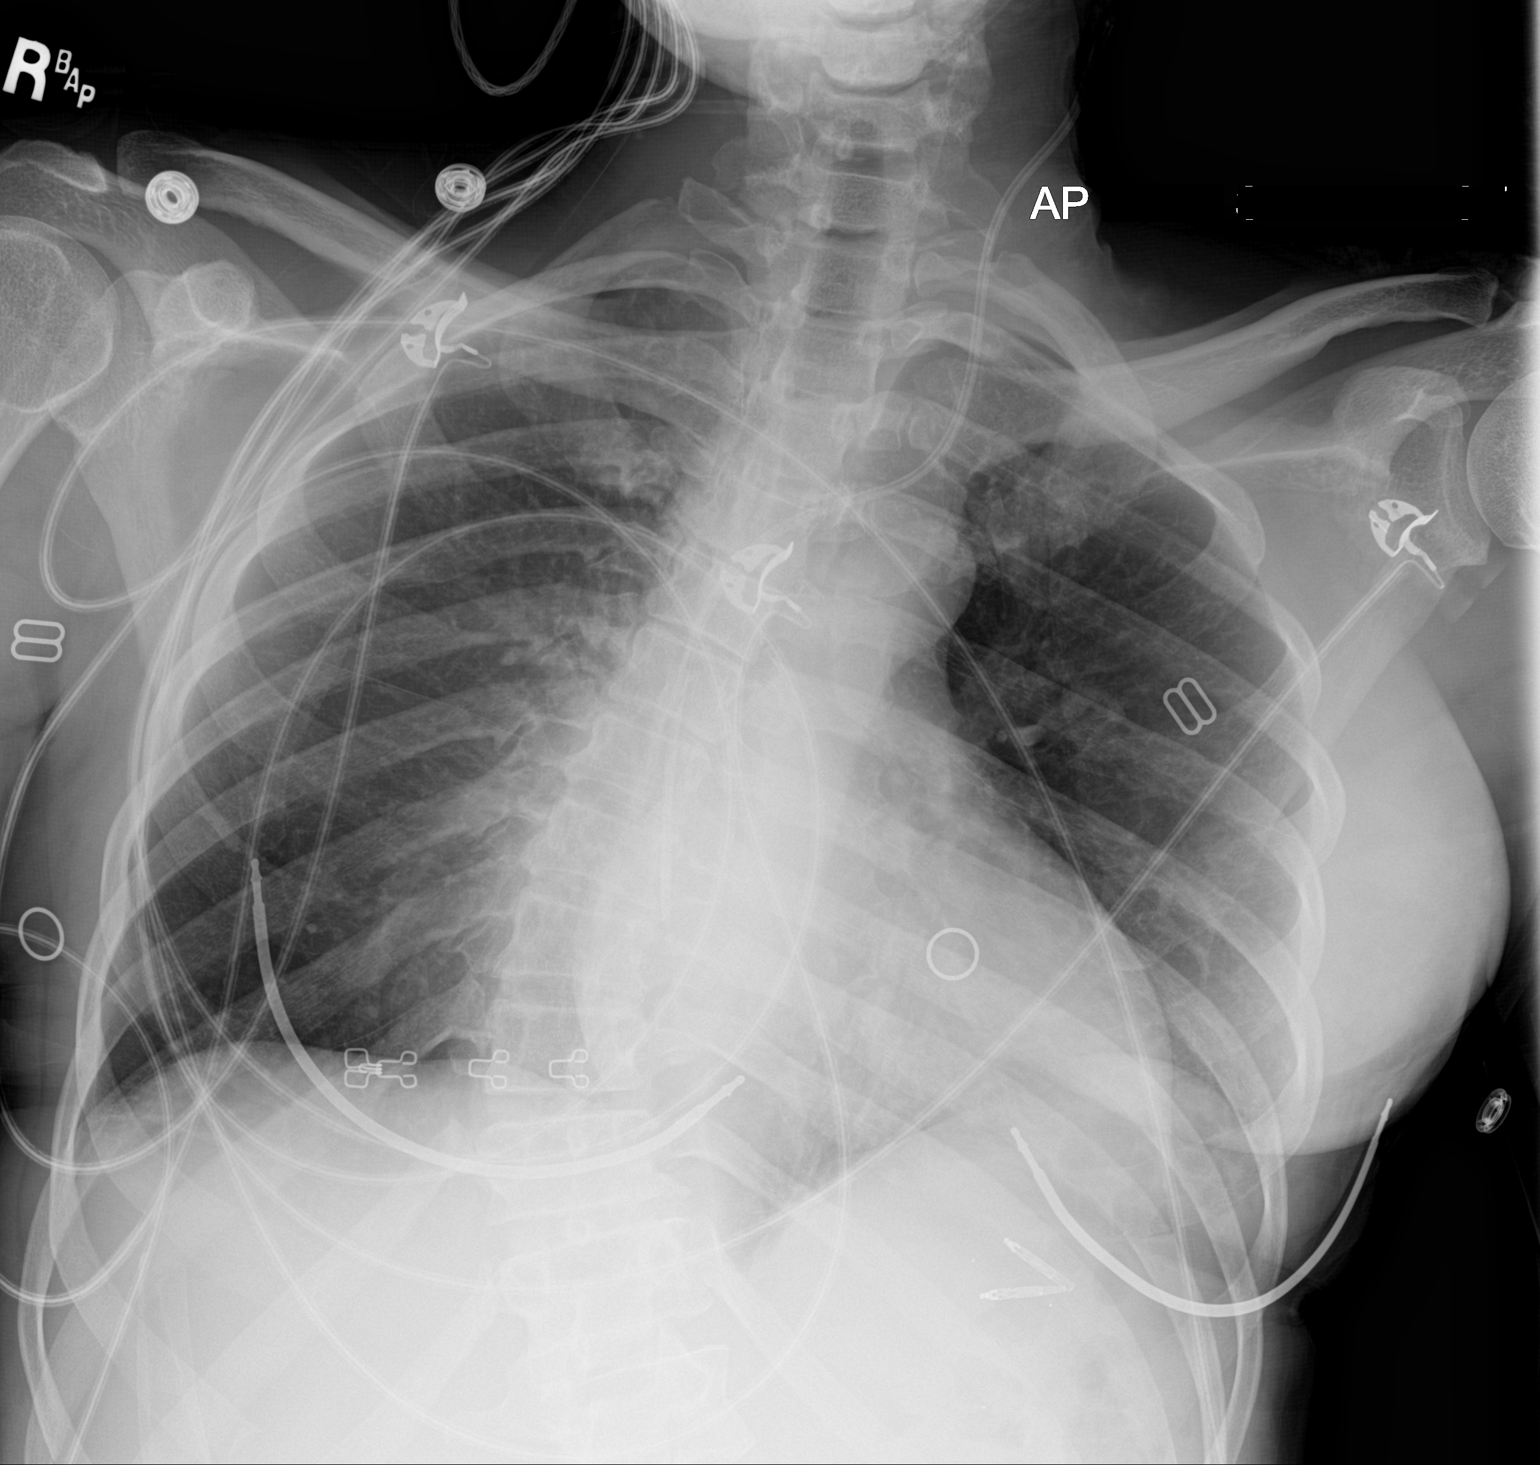

[1 of 1 positions shown; findings below may reference images not displayed]

FINDINGS: Left internal jugular line terminates at the cavoatrial junction or
high right atrium. Patient rotated to the left. Spinal curvature is
convex right and distorts the appearance of the chest. Midline
trachea. Cardiomegaly accentuated by AP portable technique. No
pleural effusion or pneumothorax. No congestive failure. Low lung
volumes. Left hemidiaphragm elevation is mild. No lobar
consolidation. Numerous leads and wires project over the chest.
IMPRESSION: Left internal jugular line tip at superior caval/ atrial junction
versusr high right atrium. If low SVC position is desired, this
could be retracted 2 cm.

Cardiomegaly and low lung volumes. No pneumothorax or other acute
superimposed process.

## 2019-01-13 DIAGNOSIS — Z0000 Encounter for general adult medical examination without abnormal findings: Secondary | ICD-10-CM | POA: Diagnosis not present

## 2019-01-13 DIAGNOSIS — Z1159 Encounter for screening for other viral diseases: Secondary | ICD-10-CM | POA: Diagnosis not present

## 2019-01-13 DIAGNOSIS — I10 Essential (primary) hypertension: Secondary | ICD-10-CM | POA: Diagnosis not present

## 2019-01-13 DIAGNOSIS — Z1231 Encounter for screening mammogram for malignant neoplasm of breast: Secondary | ICD-10-CM | POA: Diagnosis not present

## 2019-05-09 ENCOUNTER — Other Ambulatory Visit: Payer: Self-pay | Admitting: Family Medicine

## 2019-05-09 DIAGNOSIS — Z1231 Encounter for screening mammogram for malignant neoplasm of breast: Secondary | ICD-10-CM

## 2019-09-02 ENCOUNTER — Ambulatory Visit
Admission: RE | Admit: 2019-09-02 | Discharge: 2019-09-02 | Disposition: A | Discharge status: Home / Self Care | Payer: BC Managed Care – PPO | Source: Ambulatory Visit | Attending: Family Medicine | Admitting: Family Medicine

## 2019-09-02 DIAGNOSIS — Z1231 Encounter for screening mammogram for malignant neoplasm of breast: Secondary | ICD-10-CM | POA: Insufficient documentation

## 2019-09-13 ENCOUNTER — Other Ambulatory Visit: Payer: Self-pay | Admitting: Family Medicine

## 2019-09-13 DIAGNOSIS — R928 Other abnormal and inconclusive findings on diagnostic imaging of breast: Secondary | ICD-10-CM

## 2019-09-13 DIAGNOSIS — N6489 Other specified disorders of breast: Secondary | ICD-10-CM

## 2019-09-27 ENCOUNTER — Ambulatory Visit
Admission: RE | Admit: 2019-09-27 | Discharge: 2019-09-27 | Disposition: A | Discharge status: Home / Self Care | Payer: BC Managed Care – PPO | Source: Ambulatory Visit | Attending: Family Medicine | Admitting: Family Medicine

## 2019-09-27 DIAGNOSIS — R928 Other abnormal and inconclusive findings on diagnostic imaging of breast: Secondary | ICD-10-CM | POA: Insufficient documentation

## 2019-09-27 DIAGNOSIS — N6489 Other specified disorders of breast: Secondary | ICD-10-CM | POA: Insufficient documentation

## 2021-03-20 ENCOUNTER — Other Ambulatory Visit: Payer: Self-pay | Admitting: Family Medicine

## 2021-03-20 DIAGNOSIS — Z1231 Encounter for screening mammogram for malignant neoplasm of breast: Secondary | ICD-10-CM

## 2021-04-09 ENCOUNTER — Ambulatory Visit
Admission: RE | Admit: 2021-04-09 | Discharge: 2021-04-09 | Disposition: A | Discharge status: Home / Self Care | Payer: No Typology Code available for payment source | Source: Ambulatory Visit | Attending: Family Medicine | Admitting: Family Medicine

## 2021-04-09 ENCOUNTER — Other Ambulatory Visit: Payer: Self-pay

## 2021-04-09 DIAGNOSIS — Z1231 Encounter for screening mammogram for malignant neoplasm of breast: Secondary | ICD-10-CM | POA: Diagnosis not present

## 2022-03-11 ENCOUNTER — Other Ambulatory Visit: Payer: Self-pay | Admitting: Family Medicine

## 2022-03-11 DIAGNOSIS — Z1231 Encounter for screening mammogram for malignant neoplasm of breast: Secondary | ICD-10-CM

## 2022-05-28 ENCOUNTER — Ambulatory Visit
Admission: RE | Admit: 2022-05-28 | Discharge: 2022-05-28 | Disposition: A | Discharge status: Home / Self Care | Payer: No Typology Code available for payment source | Source: Ambulatory Visit | Attending: Family Medicine | Admitting: Family Medicine

## 2022-05-28 DIAGNOSIS — Z1231 Encounter for screening mammogram for malignant neoplasm of breast: Secondary | ICD-10-CM | POA: Diagnosis not present

## 2023-02-26 ENCOUNTER — Other Ambulatory Visit: Payer: Self-pay | Admitting: Family Medicine

## 2023-02-26 DIAGNOSIS — Z1231 Encounter for screening mammogram for malignant neoplasm of breast: Secondary | ICD-10-CM

## 2023-07-08 ENCOUNTER — Ambulatory Visit
Admission: RE | Admit: 2023-07-08 | Discharge: 2023-07-08 | Disposition: A | Discharge status: Home / Self Care | Payer: No Typology Code available for payment source | Source: Ambulatory Visit | Attending: Family Medicine | Admitting: Family Medicine

## 2023-07-08 DIAGNOSIS — Z1231 Encounter for screening mammogram for malignant neoplasm of breast: Secondary | ICD-10-CM | POA: Diagnosis present

## 2024-06-15 ENCOUNTER — Other Ambulatory Visit: Payer: Self-pay | Admitting: Family Medicine

## 2024-06-15 DIAGNOSIS — Z1231 Encounter for screening mammogram for malignant neoplasm of breast: Secondary | ICD-10-CM
